# Patient Record
Sex: Female | Born: 1935 | Race: White | Hispanic: No
Health system: Southern US, Community
[De-identification: ages and names within clinical notes are randomized; demographics above are authoritative.]

---

## 2004-05-21 ENCOUNTER — Other Ambulatory Visit: Payer: Self-pay

## 2004-05-21 ENCOUNTER — Ambulatory Visit: Payer: Self-pay | Admitting: Specialist

## 2004-05-22 ENCOUNTER — Ambulatory Visit: Payer: Self-pay | Admitting: Specialist

## 2004-05-23 ENCOUNTER — Ambulatory Visit: Payer: Self-pay | Admitting: Specialist

## 2010-03-06 ENCOUNTER — Emergency Department: Payer: Self-pay | Admitting: Emergency Medicine

## 2010-08-07 ENCOUNTER — Emergency Department: Payer: Self-pay | Admitting: Emergency Medicine

## 2010-10-23 ENCOUNTER — Ambulatory Visit: Payer: Self-pay | Admitting: Internal Medicine

## 2010-11-06 ENCOUNTER — Inpatient Hospital Stay: Payer: Self-pay | Admitting: Internal Medicine

## 2010-11-13 DIAGNOSIS — I5022 Chronic systolic (congestive) heart failure: Secondary | ICD-10-CM

## 2010-11-13 DIAGNOSIS — I10 Essential (primary) hypertension: Secondary | ICD-10-CM

## 2010-11-13 DIAGNOSIS — I6990 Unspecified sequelae of unspecified cerebrovascular disease: Secondary | ICD-10-CM

## 2010-11-13 DIAGNOSIS — J189 Pneumonia, unspecified organism: Secondary | ICD-10-CM

## 2010-11-20 DIAGNOSIS — M109 Gout, unspecified: Secondary | ICD-10-CM

## 2010-11-23 ENCOUNTER — Ambulatory Visit: Payer: Self-pay | Admitting: Internal Medicine

## 2010-11-23 DIAGNOSIS — R0989 Other specified symptoms and signs involving the circulatory and respiratory systems: Secondary | ICD-10-CM

## 2010-11-23 DIAGNOSIS — R0609 Other forms of dyspnea: Secondary | ICD-10-CM

## 2010-11-23 DIAGNOSIS — I679 Cerebrovascular disease, unspecified: Secondary | ICD-10-CM

## 2010-11-23 DIAGNOSIS — M109 Gout, unspecified: Secondary | ICD-10-CM

## 2011-01-03 DIAGNOSIS — I6990 Unspecified sequelae of unspecified cerebrovascular disease: Secondary | ICD-10-CM

## 2011-01-03 DIAGNOSIS — I10 Essential (primary) hypertension: Secondary | ICD-10-CM

## 2011-01-03 DIAGNOSIS — I5022 Chronic systolic (congestive) heart failure: Secondary | ICD-10-CM

## 2011-01-03 DIAGNOSIS — J189 Pneumonia, unspecified organism: Secondary | ICD-10-CM

## 2011-01-04 ENCOUNTER — Telehealth: Payer: Self-pay | Admitting: *Deleted

## 2011-01-04 NOTE — Telephone Encounter (Signed)
Fax from [employer] [address] is on International aid/development worker.  Benicar isnt covered by insurance and they are asking if you want to change to cozaar.  Also, prior auth is needed for ipratropium/ albuterol, that form is on your desk also.

## 2011-01-04 NOTE — Telephone Encounter (Signed)
[employer] has faxed a questionaire for duoneb, this is on your desk.  They didn't send this with the first form that they faxed.

## 2011-01-04 NOTE — Telephone Encounter (Signed)
Form done and faxed back

## 2011-01-04 NOTE — Telephone Encounter (Signed)
Form done for the duoneb The benicar was prescribed by Dr Judithann Sheen, her regular doctor, and she should get this until she discusses it with him

## 2011-01-05 NOTE — Telephone Encounter (Signed)
I did this one already She needs it due to pneumonia, hypoxemia, and wheezing/bronchospasm

## 2011-01-07 NOTE — Telephone Encounter (Signed)
She will have to work this out with her regular physician Dr Judithann Sheen She has gone home from [facility]

## 2011-01-07 NOTE — Telephone Encounter (Signed)
Prior auth denied for duoneb, letter from [place] is on your desk.

## 2011-02-26 ENCOUNTER — Ambulatory Visit: Payer: Medicare Other | Admitting: Internal Medicine

## 2011-04-03 ENCOUNTER — Ambulatory Visit: Payer: Medicare Other | Admitting: Internal Medicine

## 2011-09-19 ENCOUNTER — Ambulatory Visit: Payer: BC Managed Care – PPO

## 2011-09-19 ENCOUNTER — Ambulatory Visit: Payer: BC Managed Care – PPO | Admitting: Internal Medicine

## 2011-09-23 ENCOUNTER — Ambulatory Visit: Payer: BC Managed Care – PPO | Admitting: Internal Medicine

## 2011-09-23 DIAGNOSIS — Z0289 Encounter for other administrative examinations: Secondary | ICD-10-CM

## 2011-10-14 ENCOUNTER — Ambulatory Visit: Payer: BC Managed Care – PPO

## 2012-08-02 ENCOUNTER — Emergency Department: Payer: Self-pay | Admitting: Emergency Medicine

## 2012-08-02 LAB — COMPREHENSIVE METABOLIC PANEL
Albumin: 4 g/dL (ref 3.4–5.0)
Alkaline Phosphatase: 155 U/L — ABNORMAL HIGH (ref 50–136)
Anion Gap: 6 — ABNORMAL LOW (ref 7–16)
Bilirubin,Total: 0.4 mg/dL (ref 0.2–1.0)
Calcium, Total: 9.5 mg/dL (ref 8.5–10.1)
Chloride: 106 mmol/L (ref 98–107)
Co2: 28 mmol/L (ref 21–32)
EGFR (Non-African Amer.): 42 — ABNORMAL LOW
Glucose: 113 mg/dL — ABNORMAL HIGH (ref 65–99)
Osmolality: 295 (ref 275–301)
SGOT(AST): 33 U/L (ref 15–37)
SGPT (ALT): 26 U/L (ref 12–78)
Sodium: 140 mmol/L (ref 136–145)
Total Protein: 7.6 g/dL (ref 6.4–8.2)

## 2012-08-02 LAB — URINALYSIS, COMPLETE
Bacteria: NONE SEEN
Bilirubin,UR: NEGATIVE
Hyaline Cast: 17
Ketone: NEGATIVE
Leukocyte Esterase: NEGATIVE
Protein: NEGATIVE
Specific Gravity: 1.01 (ref 1.003–1.030)
WBC UR: 1 /HPF (ref 0–5)

## 2012-08-02 LAB — CK TOTAL AND CKMB (NOT AT ARMC): CK-MB: 1.8 ng/mL (ref 0.5–3.6)

## 2012-08-02 LAB — CBC
HCT: 34.3 % — ABNORMAL LOW (ref 35.0–47.0)
Platelet: 196 10*3/uL (ref 150–440)

## 2012-10-17 ENCOUNTER — Inpatient Hospital Stay: Payer: Self-pay | Admitting: Internal Medicine

## 2012-10-18 LAB — CBC WITH DIFFERENTIAL/PLATELET
Basophil %: 0.4 %
Eosinophil %: 1.4 %
HCT: 31.7 % — ABNORMAL LOW (ref 35.0–47.0)
Lymphocyte #: 1.3 10*3/uL (ref 1.0–3.6)
Lymphocyte %: 15.3 %
MCH: 30.9 pg (ref 26.0–34.0)
MCHC: 34.1 g/dL (ref 32.0–36.0)
MCV: 91 fL (ref 80–100)
Monocyte #: 0.7 x10 3/mm (ref 0.2–0.9)
Monocyte %: 7.9 %
RDW: 14 % (ref 11.5–14.5)
WBC: 8.7 10*3/uL (ref 3.6–11.0)

## 2012-10-18 LAB — BASIC METABOLIC PANEL
BUN: 42 mg/dL — ABNORMAL HIGH (ref 7–18)
Calcium, Total: 9.2 mg/dL (ref 8.5–10.1)
Chloride: 107 mmol/L (ref 98–107)
Osmolality: 292 (ref 275–301)
Potassium: 4 mmol/L (ref 3.5–5.1)
Sodium: 141 mmol/L (ref 136–145)

## 2012-10-19 LAB — HEMOGLOBIN
HGB: 9.9 g/dL — ABNORMAL LOW (ref 12.0–16.0)
HGB: 9.7 g/dL — ABNORMAL LOW (ref 12.0–16.0)

## 2012-10-20 LAB — CBC WITH DIFFERENTIAL/PLATELET
Basophil #: 0 10*3/uL (ref 0.0–0.1)
Basophil %: 0.3 %
Eosinophil #: 0.1 10*3/uL (ref 0.0–0.7)
Eosinophil %: 0.9 %
HCT: 28.8 % — ABNORMAL LOW (ref 35.0–47.0)
Lymphocyte #: 1.6 10*3/uL (ref 1.0–3.6)
Lymphocyte %: 19.2 %
MCH: 30.3 pg (ref 26.0–34.0)
MCHC: 33.6 g/dL (ref 32.0–36.0)
Monocyte #: 0.8 x10 3/mm (ref 0.2–0.9)
Monocyte %: 9.2 %
Neutrophil #: 5.8 10*3/uL (ref 1.4–6.5)
Neutrophil %: 70.4 %
Platelet: 162 10*3/uL (ref 150–440)
RBC: 3.19 10*6/uL — ABNORMAL LOW (ref 3.80–5.20)
RDW: 13.9 % (ref 11.5–14.5)
WBC: 8.3 10*3/uL (ref 3.6–11.0)

## 2012-10-20 LAB — BASIC METABOLIC PANEL
BUN: 30 mg/dL — ABNORMAL HIGH (ref 7–18)
Chloride: 110 mmol/L — ABNORMAL HIGH (ref 98–107)
Co2: 28 mmol/L (ref 21–32)
Creatinine: 0.9 mg/dL (ref 0.60–1.30)
EGFR (African American): >60
Glucose: 92 mg/dL (ref 65–99)
Osmolality: 291 (ref 275–301)
Sodium: 143 mmol/L (ref 136–145)

## 2012-10-21 LAB — CBC WITH DIFFERENTIAL/PLATELET
Basophil %: 0.3 %
Eosinophil %: 1.4 %
HCT: 28.2 % — ABNORMAL LOW (ref 35.0–47.0)
HGB: 9.8 g/dL — ABNORMAL LOW (ref 12.0–16.0)
Lymphocyte #: 1.2 10*3/uL (ref 1.0–3.6)
MCH: 30.9 pg (ref 26.0–34.0)
MCV: 89 fL (ref 80–100)
Monocyte #: 0.7 x10 3/mm (ref 0.2–0.9)
Neutrophil #: 5.4 10*3/uL (ref 1.4–6.5)
RBC: 3.16 10*6/uL — ABNORMAL LOW (ref 3.80–5.20)
RDW: 13.5 % (ref 11.5–14.5)

## 2012-10-21 LAB — BASIC METABOLIC PANEL
Anion Gap: 8 (ref 7–16)
Chloride: 107 mmol/L (ref 98–107)
Creatinine: 0.85 mg/dL (ref 0.60–1.30)
Glucose: 88 mg/dL (ref 65–99)
Osmolality: 285 (ref 275–301)
Potassium: 3.4 mmol/L — ABNORMAL LOW (ref 3.5–5.1)
Sodium: 141 mmol/L (ref 136–145)

## 2012-11-04 DIAGNOSIS — I120 Hypertensive chronic kidney disease with stage 5 chronic kidney disease or end stage renal disease: Secondary | ICD-10-CM

## 2012-11-04 DIAGNOSIS — E785 Hyperlipidemia, unspecified: Secondary | ICD-10-CM

## 2012-11-04 DIAGNOSIS — D649 Anemia, unspecified: Secondary | ICD-10-CM

## 2012-11-04 DIAGNOSIS — K922 Gastrointestinal hemorrhage, unspecified: Secondary | ICD-10-CM

## 2012-11-04 DIAGNOSIS — I5022 Chronic systolic (congestive) heart failure: Secondary | ICD-10-CM

## 2013-12-22 ENCOUNTER — Ambulatory Visit: Payer: Self-pay | Admitting: Internal Medicine

## 2014-01-08 LAB — COMPREHENSIVE METABOLIC PANEL
ALBUMIN: 3.2 g/dL — AB (ref 3.4–5.0)
AST: 31 U/L (ref 15–37)
Alkaline Phosphatase: 143 U/L — ABNORMAL HIGH
Anion Gap: 8 (ref 7–16)
BUN: 41 mg/dL — ABNORMAL HIGH (ref 7–18)
Bilirubin,Total: 0.2 mg/dL (ref 0.2–1.0)
CALCIUM: 9.2 mg/dL (ref 8.5–10.1)
CHLORIDE: 108 mmol/L — AB (ref 98–107)
Co2: 26 mmol/L (ref 21–32)
Creatinine: 1.18 mg/dL (ref 0.60–1.30)
EGFR (African American): 44 — ABNORMAL LOW
GFR CALC NON AF AMER: 38 — AB
GLUCOSE: 93 mg/dL (ref 65–99)
Osmolality: 293 (ref 275–301)
POTASSIUM: 4.3 mmol/L (ref 3.5–5.1)
SGPT (ALT): 20 U/L (ref 12–78)
SODIUM: 142 mmol/L (ref 136–145)
TOTAL PROTEIN: 6.9 g/dL (ref 6.4–8.2)

## 2014-01-08 LAB — URINALYSIS, COMPLETE
BILIRUBIN, UR: NEGATIVE
BLOOD: NEGATIVE
Bacteria: NONE SEEN
Bilirubin,UR: NEGATIVE
GLUCOSE, UR: NEGATIVE mg/dL (ref 0–75)
Glucose,UR: NEGATIVE mg/dL (ref 0–75)
Ketone: NEGATIVE
Ketone: NEGATIVE
NITRITE: NEGATIVE
Nitrite: NEGATIVE
PH: 6 (ref 4.5–8.0)
Ph: 6 (ref 4.5–8.0)
Protein: 100
Protein: NEGATIVE
SQUAMOUS EPITHELIAL: NONE SEEN
Specific Gravity: 1.011 (ref 1.003–1.030)
Specific Gravity: 1.005 (ref 1.003–1.030)
Squamous Epithelial: NONE SEEN
WBC UR: 89 /HPF (ref 0–5)

## 2014-01-08 LAB — CBC
HCT: 28.4 % — AB (ref 35.0–47.0)
HGB: 9.1 g/dL — ABNORMAL LOW (ref 12.0–16.0)
MCH: 26.8 pg (ref 26.0–34.0)
MCHC: 31.9 g/dL — ABNORMAL LOW (ref 32.0–36.0)
MCV: 84 fL (ref 80–100)
PLATELETS: 212 10*3/uL (ref 150–440)
RBC: 3.38 10*6/uL — ABNORMAL LOW (ref 3.80–5.20)
RDW: 14 % (ref 11.5–14.5)
WBC: 7.1 10*3/uL (ref 3.6–11.0)

## 2014-01-08 LAB — CBC WITH DIFFERENTIAL/PLATELET
BASOS ABS: 0 10*3/uL (ref 0.0–0.1)
Basophil %: 0.4 %
Eosinophil #: 0.1 10*3/uL (ref 0.0–0.7)
Eosinophil %: 1 %
HCT: 31.7 % — ABNORMAL LOW (ref 35.0–47.0)
HGB: 10.3 g/dL — AB (ref 12.0–16.0)
Lymphocyte #: 1.7 10*3/uL (ref 1.0–3.6)
Lymphocyte %: 18.6 %
MCH: 27 pg (ref 26.0–34.0)
MCHC: 32.4 g/dL (ref 32.0–36.0)
MCV: 83 fL (ref 80–100)
MONO ABS: 0.8 x10 3/mm (ref 0.2–0.9)
MONOS PCT: 8.2 %
NEUTROS PCT: 71.8 %
Neutrophil #: 6.8 10*3/uL — ABNORMAL HIGH (ref 1.4–6.5)
PLATELETS: 243 10*3/uL (ref 150–440)
RBC: 3.8 10*6/uL (ref 3.80–5.20)
RDW: 13.7 % (ref 11.5–14.5)
WBC: 9.4 10*3/uL (ref 3.6–11.0)

## 2014-01-08 LAB — IRON AND TIBC
IRON BIND. CAP.(TOTAL): 518 ug/dL — AB (ref 250–450)
IRON: 36 ug/dL — AB (ref 50–170)
Iron Saturation: 7 %
Unbound Iron-Bind.Cap.: 482 ug/dL

## 2014-01-08 LAB — FOLATE: Folic Acid: 63.6 ng/mL (ref 3.1–100.0)

## 2014-01-08 LAB — FERRITIN: Ferritin (ARMC): 8 ng/mL (ref 8–388)

## 2014-01-08 LAB — PROTIME-INR
INR: 1
Prothrombin Time: 13 secs (ref 11.5–14.7)

## 2014-01-09 ENCOUNTER — Inpatient Hospital Stay: Payer: Self-pay | Admitting: Internal Medicine

## 2014-01-09 LAB — COMPREHENSIVE METABOLIC PANEL
ALBUMIN: 3.1 g/dL — AB (ref 3.4–5.0)
ANION GAP: 8 (ref 7–16)
Alkaline Phosphatase: 141 U/L — ABNORMAL HIGH
BUN: 32 mg/dL — AB (ref 7–18)
Bilirubin,Total: 0.3 mg/dL (ref 0.2–1.0)
CALCIUM: 9.1 mg/dL (ref 8.5–10.1)
Chloride: 106 mmol/L (ref 98–107)
Co2: 27 mmol/L (ref 21–32)
Creatinine: 1.1 mg/dL (ref 0.60–1.30)
EGFR (Non-African Amer.): 42 — ABNORMAL LOW
GFR CALC AF AMER: 48 — AB
GLUCOSE: 91 mg/dL (ref 65–99)
OSMOLALITY: 288 (ref 275–301)
POTASSIUM: 3.7 mmol/L (ref 3.5–5.1)
SGOT(AST): 30 U/L (ref 15–37)
SGPT (ALT): 21 U/L (ref 12–78)
SODIUM: 141 mmol/L (ref 136–145)
Total Protein: 6.6 g/dL (ref 6.4–8.2)

## 2014-01-09 LAB — CBC WITH DIFFERENTIAL/PLATELET
BASOS ABS: 0 10*3/uL (ref 0.0–0.1)
BASOS PCT: 0.5 %
Eosinophil #: 0.1 10*3/uL (ref 0.0–0.7)
Eosinophil %: 0.9 %
HCT: 28.1 % — ABNORMAL LOW (ref 35.0–47.0)
HGB: 8.7 g/dL — AB (ref 12.0–16.0)
LYMPHS PCT: 14.7 %
Lymphocyte #: 1.1 10*3/uL (ref 1.0–3.6)
MCH: 26.1 pg (ref 26.0–34.0)
MCHC: 31.1 g/dL — ABNORMAL LOW (ref 32.0–36.0)
MCV: 84 fL (ref 80–100)
MONO ABS: 0.6 x10 3/mm (ref 0.2–0.9)
MONOS PCT: 7.5 %
Neutrophil #: 5.8 10*3/uL (ref 1.4–6.5)
Neutrophil %: 76.4 %
PLATELETS: 217 10*3/uL (ref 150–440)
RBC: 3.35 10*6/uL — AB (ref 3.80–5.20)
RDW: 13.9 % (ref 11.5–14.5)
WBC: 7.6 10*3/uL (ref 3.6–11.0)

## 2014-01-09 LAB — PROTIME-INR
INR: 1
Prothrombin Time: 12.9 secs (ref 11.5–14.7)

## 2014-01-10 LAB — CBC WITH DIFFERENTIAL/PLATELET
Basophil #: 0 10*3/uL (ref 0.0–0.1)
Basophil %: 0.3 %
EOS ABS: 0 10*3/uL (ref 0.0–0.7)
Eosinophil %: 0.3 %
HCT: 28.5 % — AB (ref 35.0–47.0)
HGB: 9.1 g/dL — AB (ref 12.0–16.0)
LYMPHS ABS: 1.2 10*3/uL (ref 1.0–3.6)
Lymphocyte %: 16.7 %
MCH: 26.7 pg (ref 26.0–34.0)
MCHC: 32 g/dL (ref 32.0–36.0)
MCV: 83 fL (ref 80–100)
MONOS PCT: 6.9 %
Monocyte #: 0.5 x10 3/mm (ref 0.2–0.9)
NEUTROS PCT: 75.8 %
Neutrophil #: 5.3 10*3/uL (ref 1.4–6.5)
Platelet: 217 10*3/uL (ref 150–440)
RBC: 3.41 10*6/uL — AB (ref 3.80–5.20)
RDW: 13.9 % (ref 11.5–14.5)
WBC: 7 10*3/uL (ref 3.6–11.0)

## 2014-01-10 LAB — BASIC METABOLIC PANEL
Anion Gap: 10 (ref 7–16)
BUN: 36 mg/dL — AB (ref 7–18)
CO2: 27 mmol/L (ref 21–32)
Calcium, Total: 9.1 mg/dL (ref 8.5–10.1)
Chloride: 105 mmol/L (ref 98–107)
Creatinine: 1.3 mg/dL (ref 0.60–1.30)
EGFR (African American): 40 — ABNORMAL LOW
EGFR (Non-African Amer.): 34 — ABNORMAL LOW
GLUCOSE: 84 mg/dL (ref 65–99)
Osmolality: 291 (ref 275–301)
POTASSIUM: 3.4 mmol/L — AB (ref 3.5–5.1)
Sodium: 142 mmol/L (ref 136–145)

## 2014-01-10 LAB — URINE CULTURE

## 2014-01-11 LAB — CBC WITH DIFFERENTIAL/PLATELET
Basophil #: 0 10*3/uL (ref 0.0–0.1)
Basophil %: 0.5 %
EOS PCT: 0.9 %
Eosinophil #: 0.1 10*3/uL (ref 0.0–0.7)
HCT: 27.6 % — AB (ref 35.0–47.0)
HGB: 8.7 g/dL — ABNORMAL LOW (ref 12.0–16.0)
LYMPHS PCT: 20.4 %
Lymphocyte #: 1.5 10*3/uL (ref 1.0–3.6)
MCH: 26.2 pg (ref 26.0–34.0)
MCHC: 31.5 g/dL — ABNORMAL LOW (ref 32.0–36.0)
MCV: 83 fL (ref 80–100)
Monocyte #: 0.7 x10 3/mm (ref 0.2–0.9)
Monocyte %: 10 %
Neutrophil #: 4.9 10*3/uL (ref 1.4–6.5)
Neutrophil %: 68.2 %
PLATELETS: 208 10*3/uL (ref 150–440)
RBC: 3.32 10*6/uL — ABNORMAL LOW (ref 3.80–5.20)
RDW: 13.9 % (ref 11.5–14.5)
WBC: 7.2 10*3/uL (ref 3.6–11.0)

## 2014-01-11 LAB — BASIC METABOLIC PANEL
Anion Gap: 8 (ref 7–16)
BUN: 35 mg/dL — ABNORMAL HIGH (ref 7–18)
CALCIUM: 8.6 mg/dL (ref 8.5–10.1)
Chloride: 105 mmol/L (ref 98–107)
Co2: 30 mmol/L (ref 21–32)
Creatinine: 1.53 mg/dL — ABNORMAL HIGH (ref 0.60–1.30)
EGFR (Non-African Amer.): 28 — ABNORMAL LOW
GFR CALC AF AMER: 32 — AB
Glucose: 99 mg/dL (ref 65–99)
Osmolality: 293 (ref 275–301)
Potassium: 2.7 mmol/L — ABNORMAL LOW (ref 3.5–5.1)
Sodium: 143 mmol/L (ref 136–145)

## 2014-01-12 LAB — BASIC METABOLIC PANEL
ANION GAP: 8 (ref 7–16)
BUN: 32 mg/dL — ABNORMAL HIGH (ref 7–18)
Calcium, Total: 8.8 mg/dL (ref 8.5–10.1)
Chloride: 108 mmol/L — ABNORMAL HIGH (ref 98–107)
Co2: 26 mmol/L (ref 21–32)
Creatinine: 1.37 mg/dL — ABNORMAL HIGH (ref 0.60–1.30)
EGFR (African American): 37 — ABNORMAL LOW
EGFR (Non-African Amer.): 32 — ABNORMAL LOW
Glucose: 114 mg/dL — ABNORMAL HIGH (ref 65–99)
OSMOLALITY: 291 (ref 275–301)
Potassium: 3.4 mmol/L — ABNORMAL LOW (ref 3.5–5.1)
Sodium: 142 mmol/L (ref 136–145)

## 2014-01-12 LAB — CBC WITH DIFFERENTIAL/PLATELET
BASOS PCT: 0.2 %
Basophil #: 0 10*3/uL (ref 0.0–0.1)
EOS ABS: 0.1 10*3/uL (ref 0.0–0.7)
Eosinophil %: 1.1 %
HCT: 28.8 % — AB (ref 35.0–47.0)
HGB: 9.4 g/dL — ABNORMAL LOW (ref 12.0–16.0)
Lymphocyte #: 1.2 10*3/uL (ref 1.0–3.6)
Lymphocyte %: 15.6 %
MCH: 26.8 pg (ref 26.0–34.0)
MCHC: 32.7 g/dL (ref 32.0–36.0)
MCV: 82 fL (ref 80–100)
MONO ABS: 0.7 x10 3/mm (ref 0.2–0.9)
MONOS PCT: 9.1 %
NEUTROS ABS: 5.8 10*3/uL (ref 1.4–6.5)
Neutrophil %: 74 %
Platelet: 221 10*3/uL (ref 150–440)
RBC: 3.52 10*6/uL — AB (ref 3.80–5.20)
RDW: 13.8 % (ref 11.5–14.5)
WBC: 7.8 10*3/uL (ref 3.6–11.0)

## 2014-01-18 DIAGNOSIS — E785 Hyperlipidemia, unspecified: Secondary | ICD-10-CM

## 2014-01-18 DIAGNOSIS — I6990 Unspecified sequelae of unspecified cerebrovascular disease: Secondary | ICD-10-CM

## 2014-01-18 DIAGNOSIS — I10 Essential (primary) hypertension: Secondary | ICD-10-CM

## 2014-01-18 DIAGNOSIS — I5022 Chronic systolic (congestive) heart failure: Secondary | ICD-10-CM

## 2014-01-18 DIAGNOSIS — K922 Gastrointestinal hemorrhage, unspecified: Secondary | ICD-10-CM

## 2014-01-21 DIAGNOSIS — K922 Gastrointestinal hemorrhage, unspecified: Secondary | ICD-10-CM

## 2014-01-21 DIAGNOSIS — D62 Acute posthemorrhagic anemia: Secondary | ICD-10-CM

## 2014-01-21 DIAGNOSIS — D709 Neutropenia, unspecified: Secondary | ICD-10-CM

## 2014-01-22 ENCOUNTER — Ambulatory Visit: Payer: Self-pay | Admitting: Internal Medicine

## 2014-03-14 ENCOUNTER — Inpatient Hospital Stay: Payer: Self-pay | Admitting: Internal Medicine

## 2014-03-14 LAB — CK-MB
CK-MB: 2.4 ng/mL (ref 0.5–3.6)
CK-MB: 2.6 ng/mL (ref 0.5–3.6)

## 2014-03-14 LAB — BASIC METABOLIC PANEL
Anion Gap: 7 (ref 7–16)
BUN: 30 mg/dL — AB (ref 7–18)
Calcium, Total: 8.6 mg/dL (ref 8.5–10.1)
Chloride: 108 mmol/L — ABNORMAL HIGH (ref 98–107)
Co2: 26 mmol/L (ref 21–32)
Creatinine: 1.37 mg/dL — ABNORMAL HIGH (ref 0.60–1.30)
EGFR (Non-African Amer.): 32 — ABNORMAL LOW
GFR CALC AF AMER: 37 — AB
Glucose: 131 mg/dL — ABNORMAL HIGH (ref 65–99)
OSMOLALITY: 289 (ref 275–301)
Potassium: 4.3 mmol/L (ref 3.5–5.1)
Sodium: 141 mmol/L (ref 136–145)

## 2014-03-14 LAB — URINALYSIS, COMPLETE
BLOOD: NEGATIVE
Bilirubin,UR: NEGATIVE
GLUCOSE, UR: NEGATIVE mg/dL (ref 0–75)
Ketone: NEGATIVE
NITRITE: POSITIVE
Ph: 5 (ref 4.5–8.0)
Specific Gravity: 1.01 (ref 1.003–1.030)

## 2014-03-14 LAB — CBC
HCT: 24.9 % — ABNORMAL LOW (ref 35.0–47.0)
HGB: 7.5 g/dL — ABNORMAL LOW (ref 12.0–16.0)
MCH: 21.9 pg — AB (ref 26.0–34.0)
MCHC: 30.2 g/dL — ABNORMAL LOW (ref 32.0–36.0)
MCV: 73 fL — ABNORMAL LOW (ref 80–100)
PLATELETS: 279 10*3/uL (ref 150–440)
RBC: 3.44 10*6/uL — AB (ref 3.80–5.20)
RDW: 17.4 % — ABNORMAL HIGH (ref 11.5–14.5)
WBC: 7.7 10*3/uL (ref 3.6–11.0)

## 2014-03-14 LAB — TROPONIN I
Troponin-I: <0.02 ng/mL
Troponin-I: <0.02 ng/mL

## 2014-03-15 LAB — CK-MB: CK-MB: 2.1 ng/mL (ref 0.5–3.6)

## 2014-03-15 LAB — TROPONIN I: Troponin-I: <0.02 ng/mL

## 2014-03-16 LAB — CBC WITH DIFFERENTIAL/PLATELET
Basophil #: 0 10*3/uL (ref 0.0–0.1)
Basophil %: 0.4 %
Eosinophil #: 0.1 10*3/uL (ref 0.0–0.7)
Eosinophil %: 1.2 %
HCT: 27.2 % — ABNORMAL LOW (ref 35.0–47.0)
HGB: 8.2 g/dL — AB (ref 12.0–16.0)
Lymphocyte #: 1.3 10*3/uL (ref 1.0–3.6)
Lymphocyte %: 19 %
MCH: 22.4 pg — AB (ref 26.0–34.0)
MCHC: 30 g/dL — AB (ref 32.0–36.0)
MCV: 75 fL — ABNORMAL LOW (ref 80–100)
Monocyte #: 0.7 x10 3/mm (ref 0.2–0.9)
Monocyte %: 10.8 %
Neutrophil #: 4.6 10*3/uL (ref 1.4–6.5)
Neutrophil %: 68.6 %
Platelet: 242 10*3/uL (ref 150–440)
RBC: 3.65 10*6/uL — AB (ref 3.80–5.20)
RDW: 18.4 % — ABNORMAL HIGH (ref 11.5–14.5)
WBC: 6.7 10*3/uL (ref 3.6–11.0)

## 2014-03-16 LAB — BASIC METABOLIC PANEL
ANION GAP: 7 (ref 7–16)
BUN: 36 mg/dL — AB (ref 7–18)
CALCIUM: 8.6 mg/dL (ref 8.5–10.1)
CREATININE: 1.31 mg/dL — AB (ref 0.60–1.30)
Chloride: 106 mmol/L (ref 98–107)
Co2: 27 mmol/L (ref 21–32)
EGFR (African American): 39 — ABNORMAL LOW
GFR CALC NON AF AMER: 34 — AB
Glucose: 90 mg/dL (ref 65–99)
OSMOLALITY: 287 (ref 275–301)
Potassium: 3.5 mmol/L (ref 3.5–5.1)
Sodium: 140 mmol/L (ref 136–145)

## 2014-03-17 LAB — CBC WITH DIFFERENTIAL/PLATELET
BASOS ABS: 0 10*3/uL (ref 0.0–0.1)
Basophil %: 0.3 %
Eosinophil #: 0.1 10*3/uL (ref 0.0–0.7)
Eosinophil %: 1.4 %
HCT: 28 % — AB (ref 35.0–47.0)
HGB: 8.5 g/dL — AB (ref 12.0–16.0)
LYMPHS ABS: 1.3 10*3/uL (ref 1.0–3.6)
LYMPHS PCT: 19.7 %
MCH: 22.7 pg — ABNORMAL LOW (ref 26.0–34.0)
MCHC: 30.4 g/dL — ABNORMAL LOW (ref 32.0–36.0)
MCV: 75 fL — AB (ref 80–100)
MONO ABS: 0.7 x10 3/mm (ref 0.2–0.9)
MONOS PCT: 10.4 %
NEUTROS PCT: 68.2 %
Neutrophil #: 4.5 10*3/uL (ref 1.4–6.5)
Platelet: 221 10*3/uL (ref 150–440)
RBC: 3.75 10*6/uL — ABNORMAL LOW (ref 3.80–5.20)
RDW: 18 % — AB (ref 11.5–14.5)
WBC: 6.6 10*3/uL (ref 3.6–11.0)

## 2014-03-17 LAB — BASIC METABOLIC PANEL
Anion Gap: 6 — ABNORMAL LOW (ref 7–16)
BUN: 44 mg/dL — ABNORMAL HIGH (ref 7–18)
CALCIUM: 8.4 mg/dL — AB (ref 8.5–10.1)
CHLORIDE: 107 mmol/L (ref 98–107)
CREATININE: 1.5 mg/dL — AB (ref 0.60–1.30)
Co2: 28 mmol/L (ref 21–32)
EGFR (African American): 41 — ABNORMAL LOW
EGFR (Non-African Amer.): 34 — ABNORMAL LOW
Glucose: 87 mg/dL (ref 65–99)
OSMOLALITY: 292 (ref 275–301)
Potassium: 3.7 mmol/L (ref 3.5–5.1)
SODIUM: 141 mmol/L (ref 136–145)

## 2014-03-18 DIAGNOSIS — I5032 Chronic diastolic (congestive) heart failure: Secondary | ICD-10-CM

## 2014-03-18 DIAGNOSIS — J189 Pneumonia, unspecified organism: Secondary | ICD-10-CM

## 2014-03-18 DIAGNOSIS — J84112 Idiopathic pulmonary fibrosis: Secondary | ICD-10-CM

## 2014-03-18 DIAGNOSIS — I10 Essential (primary) hypertension: Secondary | ICD-10-CM

## 2014-03-18 DIAGNOSIS — J449 Chronic obstructive pulmonary disease, unspecified: Secondary | ICD-10-CM

## 2014-03-19 LAB — CULTURE, BLOOD (SINGLE)

## 2014-03-30 DIAGNOSIS — R1031 Right lower quadrant pain: Secondary | ICD-10-CM

## 2014-03-30 DIAGNOSIS — K922 Gastrointestinal hemorrhage, unspecified: Secondary | ICD-10-CM

## 2014-03-30 DIAGNOSIS — D62 Acute posthemorrhagic anemia: Secondary | ICD-10-CM

## 2014-04-12 DIAGNOSIS — J8410 Pulmonary fibrosis, unspecified: Secondary | ICD-10-CM

## 2014-04-12 DIAGNOSIS — I10 Essential (primary) hypertension: Secondary | ICD-10-CM

## 2014-04-12 DIAGNOSIS — F39 Unspecified mood [affective] disorder: Secondary | ICD-10-CM

## 2014-04-12 DIAGNOSIS — I5032 Chronic diastolic (congestive) heart failure: Secondary | ICD-10-CM

## 2014-04-12 DIAGNOSIS — K922 Gastrointestinal hemorrhage, unspecified: Secondary | ICD-10-CM

## 2014-05-11 DIAGNOSIS — F323 Major depressive disorder, single episode, severe with psychotic features: Secondary | ICD-10-CM

## 2014-05-11 DIAGNOSIS — J84112 Idiopathic pulmonary fibrosis: Secondary | ICD-10-CM

## 2014-05-11 DIAGNOSIS — I5030 Unspecified diastolic (congestive) heart failure: Secondary | ICD-10-CM

## 2014-05-11 DIAGNOSIS — I10 Essential (primary) hypertension: Secondary | ICD-10-CM

## 2014-05-11 DIAGNOSIS — M1000 Idiopathic gout, unspecified site: Secondary | ICD-10-CM

## 2014-05-11 DIAGNOSIS — K922 Gastrointestinal hemorrhage, unspecified: Secondary | ICD-10-CM

## 2014-05-30 ENCOUNTER — Telehealth: Payer: Self-pay

## 2014-05-30 NOTE — Telephone Encounter (Signed)
This is actually up from last time

## 2014-05-30 NOTE — Telephone Encounter (Signed)
PLEASE NOTE: All timestamps contained within this report are represented as [place] Standard Time. CONFIDENTIALTY NOTICE: This fax transmission is intended only for the addressee. It contains information that is legally privileged, confidential or otherwise protected from use or disclosure. If you are not the intended recipient, you are strictly prohibited from reviewing, disclosing, copying using or disseminating any of this information or taking any action in reliance on or regarding this information. If you have received this fax in error, please notify us immediately by telephone so that we can arrange for its return to us. Phone: [phone], Toll-Free: [phone], Fax: [phone] Page: 1 of 1 Call Id: [id number] [facility] Client TELEPHONE ADVICE RECORD [facility] Patient Name: Madison LoopANCY Tarter Gender: Female DOB: 1912/11/11 Age: [age] Y 2 M 19 D Return Phone Number: Address: City/State/Zip: [place] StatisticianClient [facility] - Night Physician Tillman AbideLetvak, Richard Contact Type Call Call Type Page Only Caller Name Madison Patel Relationship To Patient Provider Is this call to report lab results? No Return Phone Number Unavailable Initial Comment (1st attempt, Phil McGowen) Caller States Madison from [facility] at ph [phone] , Pt 's lab came back, Hemoglobin is low Nurse Assessment Guidelines Guideline Title Affirmed Question Affirmed Notes Nurse Date/Time (Eastern Time) Disp. Time Lamount Cohen(Eastern Time) Disposition Final User 05/27/2014 10:46:35 PM Send to [facility] Paging Queue Namon CirriFlanagan, Kathy 05/27/2014 10:54:04 PM Paged On Call back to Call Center - PC LakelineWood, Amy 05/27/2014 10:57:30 PM Page Completed Valentino HueYes Lucretia RoersWood, Amy Comments User: Angelique BlonderAmy, Wood Date/Time Lamount Cohen(Eastern Time): 05/27/2014 10:56:23 PM Phoned on call. Information provided. On call transferred to caller.

## 2014-06-08 ENCOUNTER — Telehealth: Payer: Self-pay

## 2014-06-08 NOTE — Telephone Encounter (Signed)
PLEASE NOTE: All timestamps contained within this report are represented as [place] Standard Time. CONFIDENTIALTY NOTICE: This fax transmission is intended only for the addressee. It contains information that is legally privileged, confidential or otherwise protected from use or disclosure. If you are not the intended recipient, you are strictly prohibited from reviewing, disclosing, copying using or disseminating any of this information or taking any action in reliance on or regarding this information. If you have received this fax in error, please notify us immediately by telephone so that we can arrange for its return to us. Phone: [phone], Toll-Free: [phone], Fax: [phone] Page: 1 of 3 Call Id: [id number] [facility] - Client TELEPHONE ADVICE RECORD [facility] Patient Name: Madison Patel Gender: Female DOB: 1913/04/06 Age: [age] Y 2 M 30 D Return Phone Number: Address: City/State/Zip: [place] StatisticianClient [facility] - [facility] Physician Tillman AbideLetvak, Richard Contact Type Call Call Type Triage / Clinical Caller Name Charyl Biggerlizabeth Mitchell Relationship To Patient Other Return Phone Number Unavailable Chief Complaint Paging or Request for Consult Initial Comment Caller states that she is [facility]el at [facility]. She is calling to notify the doctor of a 2.5 pound weight gain. 132 yesterday and is at 134.5 today. Call back # is [phone]. Nurse Assessment Nurse: Sherilyn CooterHenry, RN, Thurmond ButtsWade Date/Time Lamount Cohen(Eastern Time): 06/08/2014 7:19:31 AM Confirm and document reason for call. If symptomatic, describe symptoms. ---Per Massie BougieBelinda, the patient had a weight gain of 2.5lbs in a 24 hour time period. Denies any SOB and chest pains. She states that she is currently on Lasix 40mg /day. She states that the last time this happened, the doctor ordered an extra 20mg  of Lasix. Has  the patient traveled out of the country within the last 30 days? ---Not Applicable Does the patient require triage? ---No Guidelines Guideline Title Affirmed Question Affirmed Notes Nurse Date/Time (Eastern Time) Disp. Time Lamount Cohen(Eastern Time) Disposition Final User 06/08/2014 6:38:36 AM [facility] to [facility] Paging Queue Antonieta IbaHodge, Shannon 06/08/2014 7:03:20 AM Send To Clinical Follow Up Youlanda RoysQueue Hodge, Shannon 06/08/2014 7:22:49 AM Called On-Call Provider Sherilyn CooterHenry, RN, Thurmond ButtsWade 06/08/2014 7:26:11 AM Clinical Call Yes Sherilyn CooterHenry, RN, Thurmond ButtsWade After Care Instructions Given Call Event Type User Date / Time Description Verbal Orders/Maintenance Medications Medication Refill Route Dosage Regime Duration Admin Instructions User Name Furosemide 20mg  Oral Take a one time dose orally. 1 Days Furosemide 20mg  Take a one time dose orally. Sherilyn CooterHenry, RN, Thurmond ButtsWade PLEASE NOTE: All timestamps contained within this report are represented as [place] Standard Time. CONFIDENTIALTY NOTICE: This fax transmission is intended only for the addressee. It contains information that is legally privileged, confidential or otherwise protected from use or disclosure. If you are not the intended recipient, you are strictly prohibited from reviewing, disclosing, copying using or disseminating any of this information or taking any action in reliance on or regarding this information. If you have received this fax in error, please notify us immediately by telephone so that we can arrange for its return to us. Phone: [phone], Toll-Free: [phone], Fax: [phone] Page: 2 of 3 Call Id: [id number] Verbal Orders/Maintenance Medications Medication Refill Route Dosage Regime Duration Admin Instructions User Name #1, 0RF per Dr. Ronna PolioJennifer Walker Comments User: Ronney AstersWade, Henry, RN Date/Time Lamount Cohen(Eastern Time): 06/08/2014 7:25:58 AM 0725- Madison Patel advised of Dr. Tilman NeatWalker's order. She verbalized understanding. Paging DoctorName DoctorPhone DateTime Result/Outcome  Notes Ronna PolioWalker, Jennifer [phone] 06/08/2014 7:22:49 AM Called On Call Provider - Reached Ronna PolioWalker, Jennifer 06/08/2014 7:23:14 AM  Spoke with On Call - General [phone]- I called Dr. Ronna PolioJennifer Walker. Information given. Order rexeived. PLEASE NOTE: All timestamps contained within this report are represented as [place] Standard Time. CONFIDENTIALTY NOTICE: This fax transmission is intended only for the addressee. It contains information that is legally privileged, confidential or otherwise protected from use or disclosure. If you are not the intended recipient, you are strictly prohibited from reviewing, disclosing, copying using or disseminating any of this information or taking any action in reliance on or regarding this information. If you have received this fax in error, please notify us immediately by telephone so that we can arrange for its return to us. Phone: [phone], Toll-Free: [phone], Fax: [phone] Page: 3 of 3 Call Id: [phone] [facility] [address] [place], [place] [address] [phone] Fax: [phone] MEDICATION ORDER [facility] - Night Date: 06/08/2014 From: QI Department To: Tillman AbideLetvak, Richard Please sign the order for the approved drug(s) given by our call center nurse on your behalf. Fax to [phone] within 5 business days. Thank you. Date Lamount Cohen(Eastern Time): 06/08/2014 6:34:30 AM Triage RN: Ronney AstersWade Henry, RN NAME: Madison Patel PHONE NUMBER: BIRTHDATE: April 02, 1913 ADDRESS: CITY/STATE/ZIP: [place] CALLER: Other NAME: Charyl BiggerElizabeth Mitchell Rx Given Medication Refill Route Dosage Regime Duration Admin Instructions Furosemide 20mg  Oral Take a one time dose orally. 1 Days Furosemide 20mg  time dose orally. #1, Dr. Ronna PolioJennifer Walker MD Signature Date

## 2014-06-08 NOTE — Telephone Encounter (Signed)
Can review her status there today--Madison Patel there this morning and I will be there in the afternoon

## 2014-06-09 NOTE — Telephone Encounter (Signed)
[facility]

## 2014-06-09 NOTE — Telephone Encounter (Signed)
PLEASE NOTE: All timestamps contained within this report are represented as [place] Standard Time. CONFIDENTIALTY NOTICE: This fax transmission is intended only for the addressee. It contains information that is legally privileged, confidential or otherwise protected from use or disclosure. If you are not the intended recipient, you are strictly prohibited from reviewing, disclosing, copying using or disseminating any of this information or taking any action in reliance on or regarding this information. If you have received this fax in error, please notify us immediately by telephone so that we can arrange for its return to us. Phone: [phone], Toll-Free: [phone], Fax: [phone] Page: 1 of 3 Call Id: [id number] [facility] - Client TELEPHONE ADVICE RECORD [facility] Patient Name: Madison LoopANCY Mccarthy Gender: Female DOB: 06/30/1912 Age: 80 Y 2 M 29 D Return Phone Number: Address: City/State/Zip: [place] StatisticianClient [facility] Client [facility] Night Physician Tillman AbideLetvak, Richard Contact Type Call Call Type Triage / Clinical Caller Name Charyl Biggerlizabeth Mitchell Relationship To Patient Other Return Phone Number Unavailable Chief Complaint Paging or Request for Consult Initial Comment Caller states that she is [name] at [facility]. She is calling to notify the doctor of a 2.5 pound weight gain. 132 yesterday and is at 134.5 today. Call back # is [phone]. Nurse Assessment Nurse: Sherilyn CooterHenry, RN, Thurmond ButtsWade Date/Time Lamount Cohen(Eastern Time): 06/08/2014 7:19:31 AM Confirm and document reason for call. If symptomatic, describe symptoms. ---Per Massie BougieBelinda, the patient had a weight gain of 2.5lbs in a 24 hour time period. Denies any SOB and chest pains. She states that she is currently on Lasix 40mg /day. She states that the last time this happened, the doctor ordered an extra 20mg  of Lasix. Has  the patient traveled out of the country within the last 30 days? ---Not Applicable Does the patient require triage? ---No Guidelines Guideline Title Affirmed Question Affirmed Notes Nurse Date/Time (Eastern Time) Disp. Time Lamount Cohen(Eastern Time) Disposition Final User 06/08/2014 6:38:36 AM [facility] to [facility] Paging Queue Antonieta IbaHodge, Shannon 06/08/2014 7:03:20 AM Send To Clinical Follow Up Youlanda RoysQueue Hodge, Shannon 06/08/2014 7:22:49 AM Called On-Call Provider Sherilyn CooterHenry, RN, Thurmond ButtsWade 06/08/2014 7:26:11 AM Clinical Call Yes Sherilyn CooterHenry, RN, Thurmond ButtsWade After Care Instructions Given Call Event Type User Date / Time Description Verbal Orders/Maintenance Medications Medication Refill Route Dosage Regime Duration Admin Instructions User Name Furosemide 20mg  Oral Take a one time dose orally. 1 Days Furosemide 20mg  Take a one time dose orally. Sherilyn CooterHenry, RN, Thurmond ButtsWade PLEASE NOTE: All timestamps contained within this report are represented as [place] Standard Time. CONFIDENTIALTY NOTICE: This fax transmission is intended only for the addressee. It contains information that is legally privileged, confidential or otherwise protected from use or disclosure. If you are not the intended recipient, you are strictly prohibited from reviewing, disclosing, copying using or disseminating any of this information or taking any action in reliance on or regarding this information. If you have received this fax in error, please notify us immediately by telephone so that we can arrange for its return to us. Phone: [phone], Toll-Free: [phone], Fax: [phone] Page: 2 of 3 Call Id: [id number] Verbal Orders/Maintenance Medications Medication Refill Route Dosage Regime Duration Admin Instructions User Name #1, 0RF per Dr. Ronna PolioJennifer Walker Comments User: Ronney AstersWade, Henry, RN Date/Time Lamount Cohen(Eastern Time): 06/08/2014 7:25:58 AM 0725- [name] advised of Dr. Tilman NeatWalker's order. She verbalized understanding. Paging DoctorName DoctorPhone DateTime Result/Outcome  Notes Ronna PolioWalker, Jennifer [phone] 06/08/2014 7:22:49 AM Called On Call Provider - Reached Ronna PolioWalker, Jennifer 06/08/2014 7:23:14 AM  Spoke with On Call - General [phone]- I called Dr. Ronna PolioJennifer Walker. Information given. Order rexeived. PLEASE NOTE: All timestamps contained within this report are represented as [place] Standard Time. CONFIDENTIALTY NOTICE: This fax transmission is intended only for the addressee. It contains information that is legally privileged, confidential or otherwise protected from use or disclosure. If you are not the intended recipient, you are strictly prohibited from reviewing, disclosing, copying using or disseminating any of this information or taking any action in reliance on or regarding this information. If you have received this fax in error, please notify us immediately by telephone so that we can arrange for its return to us. Phone: [phone], Toll-Free: [phone], Fax: [phone] Page: 3 of 3 Call Id: [id number] [facility] Call Center [address] [place], [place] [address] [phone] Fax: [phone] MEDICATION ORDER [facility] Night Date: 06/08/2014 From: QI Department To: Tillman AbideLetvak, Richard Please sign the order for the approved drug(s) given by our call center nurse on your behalf. Fax to [phone] within 5 business days. Thank you. Date Lamount Cohen(Eastern Time): 06/08/2014 6:34:30 AM Triage RN: Ronney AstersWade Henry, RN NAME: Madison LoopNANCY Patel PHONE NUMBER: BIRTHDATE: 04-27-1913 ADDRESS: CITY/STATE/ZIP: [place] CALLER: Other NAME: Charyl BiggerElizabeth Mitchell Rx Given Medication Refill Route Dosage Regime Duration Admin Instructions Furosemide 20mg  Oral Take a one time dose orally. 1 Days Furosemide 20mg  time dose orally. #1, Dr. Ronna PolioJennifer Walker MD Signature Date

## 2014-06-14 DIAGNOSIS — F39 Unspecified mood [affective] disorder: Secondary | ICD-10-CM

## 2014-06-14 DIAGNOSIS — I5032 Chronic diastolic (congestive) heart failure: Secondary | ICD-10-CM

## 2014-06-14 DIAGNOSIS — J8410 Pulmonary fibrosis, unspecified: Secondary | ICD-10-CM

## 2014-06-14 DIAGNOSIS — K625 Hemorrhage of anus and rectum: Secondary | ICD-10-CM

## 2014-07-13 DIAGNOSIS — R0602 Shortness of breath: Secondary | ICD-10-CM

## 2014-07-13 DIAGNOSIS — R05 Cough: Secondary | ICD-10-CM

## 2014-08-12 DIAGNOSIS — F39 Unspecified mood [affective] disorder: Secondary | ICD-10-CM

## 2014-08-12 DIAGNOSIS — J8410 Pulmonary fibrosis, unspecified: Secondary | ICD-10-CM

## 2014-08-12 DIAGNOSIS — K648 Other hemorrhoids: Secondary | ICD-10-CM

## 2014-08-12 DIAGNOSIS — M1000 Idiopathic gout, unspecified site: Secondary | ICD-10-CM

## 2014-08-12 DIAGNOSIS — I5030 Unspecified diastolic (congestive) heart failure: Secondary | ICD-10-CM

## 2014-08-12 DIAGNOSIS — I10 Essential (primary) hypertension: Secondary | ICD-10-CM

## 2014-10-14 DIAGNOSIS — F39 Unspecified mood [affective] disorder: Secondary | ICD-10-CM | POA: Diagnosis not present

## 2014-10-14 DIAGNOSIS — J8410 Pulmonary fibrosis, unspecified: Secondary | ICD-10-CM | POA: Diagnosis not present

## 2014-10-14 DIAGNOSIS — K625 Hemorrhage of anus and rectum: Secondary | ICD-10-CM | POA: Diagnosis not present

## 2014-10-14 DIAGNOSIS — I5032 Chronic diastolic (congestive) heart failure: Secondary | ICD-10-CM | POA: Diagnosis not present

## 2014-10-14 NOTE — Consult Note (Signed)
Chief Complaint:  Subjective/Chief Complaint seen for rectal bleeding.  denies abdominalpain, n or v.  No repeat rectal bleeding with bm.  requesting better diet.   VITAL SIGNS/ANCILLARY NOTES: **Vital Signs.:   27-Apr-14 08:47  Vital Signs Type Q 4hr  Temperature Temperature (F) 98  Celsius 36.6  Pulse Pulse 59  Respirations Respirations 20  Systolic BP Systolic BP 161  Diastolic BP (mmHg) Diastolic BP (mmHg) 53  Mean BP 74  Pulse Ox % Pulse Ox % 100  Pulse Ox Activity Level  At rest  Oxygen Delivery 2L  *Intake and Output.:   27-Apr-14 10:06  Stool  soft medium stool   Brief Assessment:  Cardiac Regular   Respiratory clear BS   Gastrointestinal details normal Soft  Nontender  Nondistended  No masses palpable  Bowel sounds normal   Lab Results: Routine Chem:  27-Apr-14 02:40   Result Comment HGB - DUPLICATE ORDER. CBC ORDERED  Result(s) reported on 18 Oct 2012 at 03:11AM.  Glucose, Serum 96  BUN  42  Creatinine (comp) 0.98  Sodium, Serum 141  Potassium, Serum 4.0  Chloride, Serum 107  CO2, Serum 27  Calcium (Total), Serum 9.2  Anion Gap 7  Osmolality (calc) 292  eGFR (African American)  56  eGFR (Non-African American)  48 (eGFR values <109m/min/1.73 m2 may be an indication of chronic kidney disease (CKD). Calculated eGFR is useful in patients with stable renal function. The eGFR calculation will not be reliable in acutely ill patients when serum creatinine is changing rapidly. It is not useful in  patients on dialysis. The eGFR calculation may not be applicable to patients at the low and high extremes of body sizes, pregnant women, and vegetarians.)  Routine Hem:  26-Apr-14 10:55   Hemoglobin (CBC)  11.6    18:48   Hemoglobin (CBC)  10.5 (Result(s) reported on 17 Oct 2012 at 07:07PM.)  27-Apr-14 02:40   Hemoglobin (CBC) -  Hemoglobin (CBC)  10.8  WBC (CBC) 8.7  RBC (CBC)  3.50  Hematocrit (CBC)  31.7  Platelet Count (CBC) 198  MCV 91  MCH 30.9   MCHC 34.1  RDW 14.0  Neutrophil % 75.0  Lymphocyte % 15.3  Monocyte % 7.9  Eosinophil % 1.4  Basophil % 0.4  Neutrophil # 6.5  Lymphocyte # 1.3  Monocyte # 0.7  Eosinophil # 0.1  Basophil # 0.0 (Result(s) reported on 18 Oct 2012 at 03:27AM.)   Assessment/Plan:  Assessment/Plan:  Assessment 1) rectal bleeding likely anal outlet/hemorrhoid versus fissure.  no repeat since admission.   Plan 1) continue rabeprazole 20 mg /d,  anusol cream tid for 10 days, helicobacter pylori serology will need fu as outpatient.   Electronic Signatures: SLoistine Simas(MD)  (Signed 27-Apr-14 12:43)  Authored: Chief Complaint, VITAL SIGNS/ANCILLARY NOTES, Brief Assessment, Lab Results, Assessment/Plan   Last Updated: 27-Apr-14 12:43 by SLoistine Simas(MD)

## 2014-10-14 NOTE — H&P (Signed)
PATIENT NAME:  Madison Patel, Madison Patel MR#:  [id number] DATE OF BIRTH:  1913-05-11  DATE OF ADMISSION:  10/17/2012  REFERRING PHYSICIAN: Lurena Joiner L. Shaune Pollack, MD   PRIMARY CARE PHYSICIAN: Duane Lope. Judithann Sheen, MD  CHIEF COMPLAINT: Bloody stools.   HISTORY OF PRESENT ILLNESS: The patient is a pleasant 81 year old Caucasian female from [place]. She came in after staff found her to have some bright red blood per rectum today. Here on rectal per ER physician, there was an external hemorrhoid as well as some dark bloody stools on exam; however, there is no current active bleeding. On further questioning, the patient stated that she has been having off and on blood in the stool. This morning, it was dripping bright red blood and also she passed 1 clot. Previous to this, the last bloody stool was in mid April. Her hemoglobin appears to be around her baseline. Her previous hemoglobin in February of this year was 11.4 and she presents with hemoglobin of 11.6 today. Hospitalist services were contacted for further evaluation and management.   PAST MEDICAL HISTORY: History of hypertension, gout, hyperlipidemia, congestive heart failure, history of previous stroke.   ALLERGIES: QUININE PER PREVIOUS H AND P.   SOCIAL HISTORY: No smoking, tobacco or alcohol use. Lives at [facility] independent living.   FAMILY HISTORY: Daughter with kidney cancer, mom with cancer as well.   OUTPATIENT MEDICATIONS: Aspirin 325 mg daily, Caduet 10 mg/10 mg 1 tab daily, calcium/vitamin D 1 tab daily, colchicine 0.6 mg 1 tab daily, furosemide 40 mg once a day, Klor-Con 10  extended-release 1 tab once a day, metoprolol tartrate 50 mg once a day, multivitamin 1 tab daily, Premarin 0.625 mg 1 tab once a day.   REVIEW OF SYSTEMS:    CONSTITUTIONAL: Denies fever, weakness, fatigue or weight changes.  EYES: No blurry vision or double vision.  EARS, NOSE, THROAT: No tinnitus. Has chronic rhinorrhea.  RESPIRATORY: Denies cough, wheezing, shortness  of breath or dyspnea on exertion.  CARDIOVASCULAR: Denies chest pain, orthopnea or swelling in the legs. Has high blood pressure.  GASTROINTESTINAL: No nausea, vomiting, diarrhea or abdominal pain. No hematemesis. Positive for bright red blood per rectum as above. Denies history of hemorrhoids.  GENITOURINARY: Denies dysuria or hematuria.  HEMATOLOGIC AND LYMPHATIC: Denies anemia or easy bruising.  SKIN: Denies any rashes.  MUSCULOSKELETAL: Has gout. NEUROLOGIC: History of strokes.  PSYCHIATRIC: Denies anxiety or insomnia.   PHYSICAL EXAMINATION:  VITAL SIGNS: Temperature on arrival was 98.6, pulse 67, respiratory rate 16. Blood pressure on arrival was 198/56, last blood pressure 177/77. O2 saturation 100% on 2 liters. GENERAL: The patient is an elderly Caucasian female lying in bed in no obvious distress, talking in full sentences.  HEENT: Normocephalic, atraumatic. Pupils are equal and reactive. Anicteric sclerae. Poor dentition. Moist mucous membranes.  NECK: Supple. No thyroid tenderness or cervical lymphadenopathy.  CARDIOVASCULAR: S1, S2. No murmurs, rubs or gallops.  LUNGS: Clear to auscultation without wheezing or rhonchi.  ABDOMEN: Soft, nontender. Hyperactive bowel sounds. No rebound or guarding.  EXTREMITIES: No significant lower extremity edema.  NEUROLOGIC: Appears to have grossly intact cranial nerves II through XII. Strength is 5/5 all extremities. Sensation is intact to light touch.  PSYCHIATRIC: Awake, alert, oriented x 2.  SKIN: No obvious rashes.   LABORATORY AND DIAGNOSTIC DATA: Glucose 86, BUN 50, creatinine 0.99, sodium 139, potassium 4.2, chloride 105, GFR of 48 which appears to be around her baseline. Troponin negative. WBC 7.9, hemoglobin 11.6; previous hemoglobin was on February 9  of this year and was 11.4; hematocrit 34.9, platelets 179. EKG: Normal sinus rhythm, left axis deviation, no acute ST elevations or depressions, some T wave inversions in aVL.   ASSESSMENT  AND PLAN: We have a [age] year old female living at independent living facility with history of hypertension and stroke, on aspirin and colchicine, who presents with off-and-on bright red blood per rectum, last episode being today. Likely lower gastrointestinal bleed, diverticular versus hemorrhoidal, without any abdominal pain. Her hemoglobin is stable at this point. The patient's blood pressure is on the higher side. There is no acute bleeding at this point, but this could recur. At this point, we would admit the patient to the hospital and check hemoglobin every 8 hours and obtain a gastroenterology consult. We would hold aspirin and nonsteroidal antiinflammatory drugs. There is no sign of upper gastrointestinal bleed. Would start some gentle intravenous fluid given history of congestive heart failure and follow type and screen. At this point, she does not appear to need a transfusion; however, if she has recurrent bleeding, we would consider getting a bleeding scan and vascular surgical consult as well. Blood pressure is on the higher side. We would decrease the amlodipine to allow for higher blood pressure at this point given acute lower gastrointestinal bleed. We would hold aspirin as stated above and continue the statin for the history of stroke. The patient does appear to have chronic kidney disease, which is stage III and stable per previous labs. We would start the patient on sequential compression devices and TEDs for deep vein thrombosis prophylaxis and proton pump inhibitor as well and start her on clears at this point and await for further gastroenterology input.   CODE STATUS: The patient is a full code at this point.   TOTAL TIME SPENT: 60 minutes. I attempted to call the patient's son, Madison Patel, however, left a message as he did not pick up.   ____________________________ Krystal EatonShayiq Shawnita Krizek, MD sa:jm D: 10/17/2012 12:52:49 ET T: 10/17/2012 13:43:52 ET JOB#: [id number]  cc: Krystal EatonShayiq Brittan Butterbaugh, MD,  <Dictator> Duane LopeJeffrey D. Judithann SheenSparks, MD Krystal EatonSHAYIQ Donoven Pett MD ELECTRONICALLY SIGNED 11/24/2012 12:26

## 2014-10-14 NOTE — Discharge Summary (Signed)
PATIENT NAME:  Madison Patel, Madison Patel MR#:  [id number] DATE OF BIRTH:  30-Jun-1912  DATE OF ADMISSION:  10/17/2012 DATE OF DISCHARGE:  10/21/2012  REASON FOR ADMISSION: Lower GI bleeding.   HISTORY OF THE PRESENT ILLNESS: Please see the dictated HPI done by Dr. Jacques NavyAhmadzia on 10/17/2012.   PAST MEDICAL HISTORY: 1.  Benign hypertension.  2.  Gout.  3.  Hyperlipidemia.  4.  Chronic systolic congestive heart failure.  5.  Previous stroke.   MEDICATIONS ON ADMISSION: Please see admission note.   ALLERGIES: QUININE.   SOCIAL HISTORY, FAMILY HISTORY AND REVIEW OF SYSTEMS: As per admission note.   PHYSICAL EXAM: The patient was in no acute distress.  VITAL SIGNS: Were stable, and she was afebrile.  HEENT EXAM: Was unremarkable.  NECK: Was supple, without JVD.  LUNGS: Were clear.  CARDIAC EXAM: Revealed a regular rate and rhythm, with a normal S1 and S2.  ABDOMEN: Soft, nontender, with, hyperactive bowel sounds.  EXTREMITIES: Without edema.  NEUROLOGIC EXAM: Was grossly nonfocal.   HOSPITAL COURSE: The patient was admitted with lower GI bleeding and anemia. Her aspirin and nonsteroidal anti-inflammatory drugs were held. She was started on some IV fluids. She was seen in consultation by gastroenterology who felt that her GI bleeding was rectal, from hemorrhoids. During the hospitalization, Helicobacter pylori serology returned positive, and she was started on amoxicillin and Biaxin. She was also maintained on AcipHex in her hospitalization. No colonoscopy was to be done by GI due to the patient's age and comorbidities. Her rectal bleeding improved, and she remained asymptomatic. Her hemoglobin stabilized. By 10/21/2012, the patient was stable and ready for discharge.   DISCHARGE DIAGNOSES: 1.  Lower gastrointestinal bleeding.  2.  Anemia.  3.  Internal hemorrhoids.  4.  Helicobacter pylori gastritis.  5.  Benign hypertension.  6.  Previous stroke.  7.  Chronic systolic congestive heart failure.    DISCHARGE MEDICATIONS: 1.  Toprol-XL 25 mg p.o. daily.  2.  Lipitor 10 mg p.o. daily.  3.  Norvasc 10 mg p.o. daily.  4.  Zoloft 25 mg p.o. daily.  5.  Advair 250/50, 1 puff b.i.d.  6.  Os-Cal D, 1 p.o. b.i.d.  7.  Multivitamin 1 p.o. daily.  8.  Colchicine 0.6 mg p.o. daily.  9.  Hydrocortisone cream, applied to rectum t.i.d.  10.  Biaxin 500 mg p.o. b.i.d. x 10 days.  11.  Amoxicillin 1000 mg p.o. b.i.d. x 10 days.  12.  AcipHex 20 mg p.o. daily.  13.  Oxygen at 2 liters per minute per nasal cannula.   FOLLOWUP PLANS AND APPOINTMENTS: The patient was discharged home with home health. She will be seen by physical therapy and nursing. She is on oxygen. She is on a low-sodium diet. She will follow up in with me in 1 to 2 weeks; sooner if needed.    ____________________________ Duane LopeJeffrey D. Judithann SheenSparks, MD jds:dm D: 10/25/2012 13:19:01 ET T: 10/25/2012 15:42:54 ET JOB#: [id number]  cc: Duane LopeJeffrey D. Judithann SheenSparks, MD, <Dictator> JEFFREY Rodena Medin SPARKS MD ELECTRONICALLY SIGNED 10/25/2012 21:24

## 2014-10-14 NOTE — Consult Note (Signed)
Chief Complaint:  Subjective/Chief Complaint seen for hematochezia.  no repeat since admission.  no abd pain nausea or emesis.  bm this am "large, normal" without blood.   VITAL SIGNS/ANCILLARY NOTES: **Vital Signs.:   28-Apr-14 17:24  Vital Signs Type Q 4hr  Temperature Temperature (F) 97.7  Celsius 36.5  Temperature Source oral  Pulse Pulse 93  Respirations Respirations 20  Systolic BP Systolic BP 150  Diastolic BP (mmHg) Diastolic BP (mmHg) 61  Mean BP 90  Pulse Ox % Pulse Ox % 95  Pulse Ox Activity Level  At rest  Oxygen Delivery Room Air/ 21 %  *Intake and Output.:   28-Apr-14 11:55  Stool  large normal   Brief Assessment:  Cardiac Regular   Respiratory clear BS   Gastrointestinal details normal Soft  Nontender  Nondistended  No masses palpable  Bowel sounds normal   Lab Results: Routine Hem:  26-Apr-14 10:55   Hemoglobin (CBC)  11.6    18:48   Hemoglobin (CBC)  10.5 (Result(s) reported on 17 Oct 2012 at 07:07PM.)  27-Apr-14 02:40   Hemoglobin (CBC) -  Hemoglobin (CBC)  10.8    14:28   Hemoglobin (CBC)  10.6 (Result(s) reported on 18 Oct 2012 at 03:08PM.)  28-Apr-14 04:52   Hemoglobin (CBC)  9.9 (Result(s) reported on 19 Oct 2012 at 05:43AM.)    15:48   Hemoglobin (CBC)  9.7 (Result(s) reported on 19 Oct 2012 at 04:36PM.)   Radiology Results: XRay:    28-Apr-14 11:02, Chest PA and Lateral  Chest PA and Lateral   REASON FOR EXAM:    hypoxia  COMMENTS:       PROCEDURE: DXR - DXR CHEST PA (OR AP) AND LATERAL  - Oct 19 2012 11:02AM     RESULT: Comparison is made to the study of Nov 07, 2010.    The lungs are hyperinflated. There is blunting of the lateral and   posterior costophrenic gutters on the right and of the posterior   costophrenic gutter on the left. The cardiac silhouette is top normal in   size. The pulmonary vascularity is not clearly engorged. The pulmonary   interstitial markings are increased in a fashion that may reflect   subsegmental  atelectasis or mild interstitial edema. The bones are   osteopenic.  IMPRESSION:  The findings are consistent with COPD. I cannot exclude   superimposed low-grade CHF in the appropriate clinical setting. In  addition early interstitial pneumonia could be present.     Dictation Site: 2        Verified By: DAVID A. SwazilandJORDAN, M.D., MD   Assessment/Plan:  Assessment/Plan:  Assessment 1) hematochezia-not recurrent in hospital 2) multiple medical issues including h/o copd, chf   Plan 1) awaiting results of helicobacte serologies 2) continue rabeprazole 3) finish 10 day course of analpram cream.  if there is a repeat episode of bleeding would consider a flex sig.   following.   Electronic Signatures: Barnetta ChapelSkulskie, Kree Rafter (MD)  (Signed 28-Apr-14 18:28)  Authored: Chief Complaint, VITAL SIGNS/ANCILLARY NOTES, Brief Assessment, Lab Results, Radiology Results, Assessment/Plan   Last Updated: 28-Apr-14 18:28 by Barnetta ChapelSkulskie, Kelise Kuch (MD)

## 2014-10-14 NOTE — Consult Note (Signed)
Chief Complaint:  Subjective/Chief Complaint seen for hematochezia.  no repeat rectal bleeding since admission.  denies abdominal pain or nausea.  good appetite.   VITAL SIGNS/ANCILLARY NOTES: **Vital Signs.:   29-Apr-14 14:36  Vital Signs Type Routine  Temperature Temperature (F) 98  Celsius 36.6  Temperature Source oral  Pulse Pulse 66  Respirations Respirations 20  Systolic BP Systolic BP 741  Diastolic BP (mmHg) Diastolic BP (mmHg) 68  Mean BP 92  Pulse Ox % Pulse Ox % 96  Pulse Ox Activity Level  At rest  Oxygen Delivery Room Air/ 21 %   Brief Assessment:  Cardiac Regular   Respiratory clear BS   Gastrointestinal details normal Soft  Nontender  Nondistended  No masses palpable  Bowel sounds normal   Lab Results: General Ref:  [id number] 46:80   Helicobacter pylori AB. IgG, IgA, IgM ========== TEST NAME ==========  ========= RESULTS =========  = REFERENCE RANGE =  HELICOBACTER P. AB PANEL  H pylori, IgM, IgG, IgA Ab H. pylori, IgG Abs              [H  2.6 U/mL             ]           0.0-0.8             Negative            <0.9                                             Indeterminate  0.9 - 1.0                                             Positive            >1.0 H. pylori, IgA Abs              [H  12.6 units           ]           0.0-8.9                                                Negative          <9.0                                                Equivocal   9.0 - 11.0                                                Positive         >11.0 H. pylori, IgM Abs              [   <9.0 units           ]  0.0-8.9                                                Negative          <9.0                                                Equivocal   9.0 - 11.0                                                Positive>11.0                                                                      .                This test was developed and its performance  characteristics determined by [facility]. It has not been                cleared or approved by the Food and Drug Administration.                Results of this test are for investigational purposes                only. The result should not be used as a diagnostic                procedure without confirmation of the diagnosis by  another medically diagnostic product or procedure.               [facility]            No: [id number]           [address], [place], [place] [address]           Lindon Romp, MD         [phone]   Result(s) reported on28 Apr 2014 at 09:17PM.  Routine Chem:  29-Apr-14 05:28   Glucose, Serum 92  BUN  30  Creatinine (comp) 0.90  Sodium, Serum 143  Potassium, Serum 3.5  Chloride, Serum  110  CO2, Serum 28  Calcium (Total), Serum 8.8  Anion Gap  5  Osmolality (calc) 291  eGFR (African American) >60  eGFR (Non-African American)  54 (eGFR values <49m/min/1.73 m2 may be an indication of chronic kidney disease (CKD). Calculated eGFR is useful in patients with stable renal function. The eGFR calculation will not be reliable in acutely ill patients when serum creatinine is changing rapidly. It is not useful in  patients on dialysis. The eGFR calculation may not be applicable to patients at the low and high extremes of body sizes, pregnant women, and vegetarians.)  Routine Hem:  26-Apr-14 10:55   Hemoglobin (CBC)  11.6    18:48   Hemoglobin (CBC)  10.5 (Result(s) reported on 17 Oct 2012 at  07:07PM.)  27-Apr-14 02:40   Hemoglobin (CBC) -  Hemoglobin (CBC)  10.8    14:28   Hemoglobin (CBC)  10.6 (Result(s) reported on 18 Oct 2012 at 03:08PM.)  28-Apr-14 04:52   Hemoglobin (CBC)  9.9 (Result(s) reported on 19 Oct 2012 at 05:43AM.)    15:48   Hemoglobin (CBC)  9.7 (Result(s) reported on 19 Oct 2012 at 04:36PM.)  29-Apr-14 05:28   WBC (CBC) 8.3  RBC (CBC)  3.19  Hemoglobin (CBC)  9.7  Hematocrit (CBC)  28.8  Platelet Count (CBC) 162   MCV 90  MCH 30.3  MCHC 33.6  RDW 13.9  Neutrophil % 70.4  Lymphocyte % 19.2  Monocyte % 9.2  Eosinophil % 0.9  Basophil % 0.3  Neutrophil # 5.8  Lymphocyte # 1.6  Monocyte # 0.8  Eosinophil # 0.1  Basophil # 0.0 (Result(s) reported on 20 Oct 2012 at 07:08AM.)   Assessment/Plan:  Assessment/Plan:  Assessment 1) hematochezia-possible anal outlet /hemorrhoidal, not recurrent, currently treating with anal pram hc med.  2) positive H pylori serology.   Plan 1) finish 10 day course of anal pram tid. 2) h. pylori treatment of rabeprazole 20 mg po bid for 10 days, biaxin 500 mg po bid for 10 days, amoxicillin 1075m po bid for 10 days.  Continue PPI (rabeprazole) daily for at lear 6 weeks t maximize response.  Rabeprazole used due to efficacy profile in elderly patients.   Electronic Signatures: SLoistine Simas(MD)  (Signed 29-Apr-14 17:34)  Authored: Chief Complaint, VITAL SIGNS/ANCILLARY NOTES, Brief Assessment, Lab Results, Assessment/Plan   Last Updated: 29-Apr-14 17:34 by SLoistine Simas(MD)

## 2014-10-14 NOTE — Consult Note (Signed)
PATIENT NAME:  Madison Patel, Madison Patel MR#:  [id number] DATE OF BIRTH:  [birth year]  DATE OF CONSULTATION:  10/17/2012  CONSULTING PHYSICIAN:  Christena Deem, MD  Patient of Dr. Krystal Eaton  REASON FOR CONSULTATION:  Lower GI bleeding.   HISTORY OF PRESENT ILLNESS:  The patient is a pleasant, 81 year old Caucasian female, who was brought to the Emergency Room this morning after a couple of episodes of bright red blood noted on the toilet paper and associated with a stool. She does have a history of some constipation. However, her bowel habits are quite variable. She states she may go up to a week and not have a bowel movement. She has been placed on MiraLAX in the past. However, when she takes it daily or even every other day, she loses continence. She does not know that she has ever had a colonoscopy. When she was seen in the Emergency Room by the ER physician, there was apparently an external hemorrhoid as well as some dark-appearing bloodier stools, but apparently no active bleeding. The patient stated that this morning, what she passed was bright red blood and a small clot. Her hemoglobin has been stable in comparison with previous hospitalization. She states that she will see some blood on the toilet paper occasionally, but this seemed to be more than usual. She stated that the last time she saw any blood on the toilet paper was on 10/09/2012. She has never been treated for hemorrhoids. She denies any problems with nausea, vomiting or abdominal pain. There is no heartburn or dysphagia.   PAST MEDICAL HISTORY: She has a history of gout, hyperlipidemia, congestive heart failure, history of stroke. She also has a history of hypertension.   SOCIAL HISTORY:  She does not use alcohol or tobacco. She lives at [facility] independent living.   GASTROINTESTINAL FAMILY HISTORY:  Negative for colorectal cancer, liver disease or ulcers.   REVIEW OF SYSTEMS:  Systems reviewed per admission history and  physical, agree with same.   PHYSICAL EXAMINATION: VITAL SIGNS: Temperature 97.6, pulse 62, respirations 18, blood pressure 162/54, pulse ox 99.  GENERAL:  She is a 81 year old Caucasian female in no acute distress. She was sitting in her room reading a book when I came in.  HEENT:  Normocephalic, atraumatic. Eyes are anicteric. Nose: Septum midline. Oropharynx: No lesions.  NECK:  No JVD.  HEART:  Regular rate and rhythm.  LUNGS:  Clear.  ABDOMEN:  Soft, nontender, nondistended. Bowel sounds positive, normoactive.  ANORECTAL EXAMINATION: Shows a very dark green stool, though not black, that is brightly Hemoccult positive. I did not feel any overt hemorrhoid, although it is possible that the specimen was contaminated on rectal examination.   LABORATORIES INCLUDE THE FOLLOWING: On admission to the hospital, she had a glucose of 86, BUN 50, creatinine 0.99, sodium 139, potassium 4.2, chloride 105, bicarb 28, calcium 9.7. Troponin I less than 0.02. Hemogram showing a white count of 7.9, H and H 11.6/34.9, platelet count of 179, MCV is 91. There was no imaging study done.   CURRENT MEDICATIONS INCLUDE:  Aspirin 325 mg a day, Caduet 10/10 mg once a day,  calcium/vitamin D once a day, Colchicine 0.6 mg once a day, Lasix 40 mg once a day, Klor-Con 10 mg once a day, metoprolol tartrate 50 mg a day, multiple vitamin, Premarin 0.625 mg once a day.   ALLERGIES:  She has no known drug allergies.   ASSESSMENT:   1.  Rectal bleeding. The patient is Hemoccult positive  on check. It was bright red rectal bleeding, however, that brought the patient in. There is no evidence of bright red rectal bleeding currently. The stool in the vault is soft and very dark green, and brightly Hemoccult positive. The patient does take a full dose aspirin daily, and is not on any type of medication in regards to gastric preventative for ulcer. Oftentimes, at this age, there is minimal acid production in the stomach. However, in the  setting of the dark stools, it may be advisable for her to be on a proton pump inhibitor. Currently she is hemodynamically stable, and there has been no further passage of any bright red material or bright red bleeding from the rectum.   RECOMMENDATIONS: 1.  Will obtain a Helicobacter pylori serology.   2.  Recheck hemoglobin in 12 hours. It is of note that the patient, in review of her labs over the past 2 years, carries an elevated BUN, and her hemoglobin is relatively stable over that time.   3.  Would give her an external treatment for anal outlet bleeding, such as Analpram-HC 2.5% cream to be applied t.i.d.   Will follow her clinically.    ____________________________ Christena DeemMartin U. Marie Chow, MD mus:mr D: 10/17/2012 18:43:32 ET T: 10/17/2012 19:18:03 ET JOB#: [id number]  cc: Christena DeemMartin U. Jacole Capley, MD, <Dictator> Christena DeemMARTIN U Pink Maye MD ELECTRONICALLY SIGNED 11/03/2012 13:02

## 2014-10-14 NOTE — Consult Note (Signed)
Brief Consult Note: Diagnosis: hematochezia.   Patient was seen by consultant.   Consult note dictated.   Recommend further assessment or treatment.   Orders entered.   Comments: Please see full GI consult.  Patient admitted with c/o bright red hematochezia.  Seen mostly on the toilet paper.  Not recurrent since admission.  On hemoccult she shows a very dark green stool that is brightly heme positive.  Patietn on ASA.  Recommend conservative treatment as she is hemodynamically stable and hgb without drop compared to previous.  Will check h. pylori serology, institute rebeprazole 20 mg po daily, analpram hc 2.5 % cream applied externally tid for 10 days.  Following.  Electronic Signatures: Barnetta ChapelSkulskie, Maika Kaczmarek (MD)  (Signed 26-Apr-14 18:48)  Authored: Brief Consult Note   Last Updated: 26-Apr-14 18:48 by Barnetta ChapelSkulskie, Roshunda Keir (MD)

## 2014-10-15 NOTE — H&P (Signed)
Subjective/Chief Complaint Rectal bleeding   History of Present Illness 81 year old lady presented with c/o two episodes of bright red bleeding per rectum yesterday. No c/o abdominal pain, nausea, vomiting or diarrhea. No dizziness or fall.   Past History Chronic anemia Hypertension Hyperlipidemia   Primary Physician Dr. Doy Hutching   Past Med/Surgical Hx:  Pneumonia:   Hypercholesterolemia:   CVA:   Gout:   HTN:   Cholecystectomy:   Tonsillectomy:   Carpal Tunnel Release:   hysterectomy:   ALLERGIES:  No Known Allergies:   Family and Social History:  Family History Coronary Artery Disease  Hypertension  Gastric cancer   Social History negative tobacco, negative ETOH   Place of Living [facility]   Review of Systems:  Fever/Chills No   Cough No   Abdominal Pain No   Nausea/Vomiting No   Chest Pain No   Dysuria No   Physical Exam:  GEN disheveled   HEENT PERRL   NECK supple  No masses  trachea midline   RESP normal resp effort  clear BS  no use of accessory muscles   CARD regular rate  no JVD   ABD denies tenderness  soft  normal BS   LYMPH negative neck   EXTR negative edema   SKIN normal to palpation   NEURO cranial nerves intact   PSYCH alert, good insight   Lab Results: Hepatic:  18-Jul-15 08:02   Bilirubin, Total 0.2  Alkaline Phosphatase  143 (45-117 NOTE: New Reference Range 05/14/13)  SGPT (ALT) 20  SGOT (AST) 31  Total Protein, Serum 6.9  Albumin, Serum  3.2  Routine BB:  18-Jul-15 08:02   ABO Group + Rh Type O Positive  Antibody Screen NEGATIVE (Result(s) reported on 08 Jan 2014 at 09:49AM.)  Routine Chem:  18-Jul-15 08:02   Glucose, Serum 93  BUN  41  Creatinine (comp) 1.18  Sodium, Serum 142  Potassium, Serum 4.3  Chloride, Serum  108  CO2, Serum 26  Calcium (Total), Serum 9.2  Osmolality (calc) 293  eGFR (African American)  44  eGFR (Non-African American)  38 (eGFR values <26m/min/1.73 m2 may be an indication of  chronic kidney disease (CKD). Calculated eGFR is useful in patients with stable renal function. The eGFR calculation will not be reliable in acutely ill patients when serum creatinine is changing rapidly. It is not useful in  patients on dialysis. The eGFR calculation may not be applicable to patients at the low and high extremes of body sizes, pregnant women, and vegetarians.)  Anion Gap 8  Routine UA:  18-Jul-15 08:01   Color (UA) Yellow  Clarity (UA) Cloudy  Glucose (UA) Negative  Bilirubin (UA) Negative  Ketones (UA) Negative  Specific Gravity (UA) 1.011  Blood (UA) Negative  pH (UA) 6.0  Protein (UA) 100 mg/dL  Nitrite (UA) Negative  Leukocyte Esterase (UA) 3+ (Result(s) reported on 08 Jan 2014 at 09:04AM.)  RBC (UA) 21 /HPF  WBC (UA) 755 /HPF  Bacteria (UA) NONE SEEN  Epithelial Cells (UA) NONE SEEN  Result(s) reported on 08 Jan 2014 at 09:04AM.  Routine Coag:  18-Jul-15 08:02   Prothrombin 13.0  INR 1.0 (INR reference interval applies to patients on anticoagulant therapy. A single INR therapeutic range for coumarins is not optimal for all indications; however, the suggested range for most indications is 2.0 - 3.0. Exceptions to the INR Reference Range may include: Prosthetic heart valves, acute myocardial infarction, prevention of myocardial infarction, and combinations of aspirin and  anticoagulant. The need for a higher or lower target INR must be assessed individually. Reference: The Pharmacology and Management of the Vitamin K  antagonists: the seventh ACCP Conference on Antithrombotic and Thrombolytic Therapy. KWIOX.7353 Sept:126 (3suppl): [id number]. A HCT value >55% may artifactually increase the PT.  In one study,  the increase was an average of 25%. Reference:  "Effect on Routine and Special Coagulation Testing Values of Citrate Anticoagulant Adjustment in Patients with High HCT Values." American Journal of Clinical Pathology 2006;126:400-405.)  Routine  Hem:  18-Jul-15 08:02   WBC (CBC) 7.1  RBC (CBC)  3.38  Hemoglobin (CBC)  9.1  Hematocrit (CBC)  28.4  Platelet Count (CBC) 212 (Result(s) reported on 08 Jan 2014 at 08:48AM.)  MCV 84  MCH 26.8  MCHC  31.9  RDW 14.0    Assessment/Admission Diagnosis 81 year old lady with h/o hypertension and hyperlipidemia admitted for bright red bleeding per rectum.   Plan Rectal bleeding: patient is hemodynamically stable, H/H 9.1/28.4, normal creatinine and PT/INR on initial ED labs, continue i.v fluids, monitor H/H, start Pantoprazole. GI consult and colonoscopy.   Chronic normocytic normochromic anemia: obtain iron studies with AM labs. CXR today.    Hypertension: continue Norvasc, Benicar, HCTZ, Toprol, Lasix and K-Dur.  Hyperlipidemia: continue Atorvastatin 10 mg daily.  Initial labs: +cloudy urine, obtain urine c/s and repeat u/a.   Electronic Signatures: Glendon Axe (MD)  (Signed [phone] 17:23)  Authored: CHIEF COMPLAINT and HISTORY, PAST MEDICAL/SURGIAL HISTORY, ALLERGIES, FAMILY AND SOCIAL HISTORY, REVIEW OF SYSTEMS, PHYSICAL EXAM, LABS, ASSESSMENT AND PLAN   Last Updated: 18-Jul-15 17:23 by Glendon Axe (MD)

## 2014-10-15 NOTE — Consult Note (Signed)
Pt seen and examined. Please see Madison Patel's notes. Pt with family hx of colon cancer. No prior colonoscopy. Pt interested in finding out the cause of her bleeding and anemia. Wants to make it to 100. Pt at increased risk due to her age. Not sure if patient can be prepped adequately. Nevertheless, will proceed with colonoscopy if family also agrees. Son not available right now.   Electronic Signatures: Lutricia Feilh, Bandy Honaker (MD)  (Signed on 20-Jul-15 15:52)  Authored  Last Updated: 20-Jul-15 15:52 by Lutricia Feilh, Rindi Beechy (MD)

## 2014-10-15 NOTE — Discharge Summary (Signed)
PATIENT NAME:  Madison Patel, POUNDS MR#:  [id number] DATE OF BIRTH:  1913/04/17  DATE OF ADMISSION:  03/14/2014 DATE OF DISCHARGE:  03/17/2014  TYPE OF DISCHARGE:  The patient transferred to a skilled nursing facility.   REASON FOR ADMISSION: Shortness of breath.   HISTORY OF PRESENT ILLNESS: The patient is a 81 year old female who resides at [facility] with a history of hypertension, hyperlipidemia and previous stroke. Came in to the Emergency Room with worsening shortness of breath. In the Emergency Room, the patient showed pleural effusions and questionable pneumonia. She was admitted for further evaluation.   PAST MEDICAL HISTORY:  1.  Previous stroke.  2.  Benign hypertension.  3.  Hyperlipidemia  4.  Gout.  5.  History of pneumonia.  6.  History of congestive heart failure.   MEDICATIONS ON ADMISSION: Please see admission note.   ALLERGIES: No known drug allergies.   SOCIAL HISTORY:  Negative for alcohol or tobacco abuse.   FAMILY HISTORY:  Positive for coronary artery disease, hypertension, gastric cancer.   REVIEW OF SYSTEMS:  As per HPI.  PHYSICAL EXAMINATION:  GENERAL: The patient was in no acute distress.  VITAL SIGNS: Stable and she was afebrile.  HEENT: Unremarkable.  NECK: Supple without JVD.  LUNGS: Revealed decreased breath sounds with basilar crackles.  CARDIAC: Revealed a regular rate and rhythm. Normal S1, S2.  ABDOMEN: Soft and nontender.  EXTREMITIES: Without edema.  NEUROLOGIC: Grossly nonfocal.   HOSPITAL COURSE: The patient was admitted with mild worsening congestive heart failure and anemia. She was also felt to have underlying chronic lung scarring and pulmonary fibrosis. She ruled out for a MI by serial cardiac enzymes. She was placed on oxygen with DuoNeb SVNs. She was also given IV Lasix. Echocardiogram was performed and revealed an ejection fraction of 70-75%. Her anemia was felt to be contributing to her heart failure. She was given 1 unit of packed red  blood cells with improvement of her anemia. Her shortness of breath improved. Physical therapy saw the patient and recommended skilled nursing. The family agreed. She is now transferred to the skilled nursing facility for further care and rehabilitation.   DISCHARGE DIAGNOSES:  1.  Acute on chronic congestive heart failure.  2.  Worsening anemia.  3.  Pulmonary fibrosis.  4.  Chronic respiratory failure.  5.  Urinary tract infection.  6.  Benign hypertension.  7.  Renal insufficiency.   DISCHARGE MEDICATIONS:  1.  Aspirin 81 mg p.o. daily.  2.  Norvasc 10 mg p.o. daily.  3.  Lipitor 10 mg p.o. at bedtime.  4.  Colchicine 0.6 mg p.o. daily.  5.  Toprol XL 50 mg p.o. daily.  6.  Benicar 40 mg p.o. daily.  7.  Zoloft 50 mg p.o. at bedtime.  8.  Protonix 40 mg p.o. daily.  9.  Albuterol 2 puffs every four hours p.r.n. shortness of breath.  10. ProctoCream HC to rectum t.i.d.  11. Symbicort 160/4.5, 2 puffs b.i.d.  12. Lasix 40 mg p.o. daily.  13. Klor-Con 20 mEq p.o. daily.  14. Levaquin 250 mg p.o. daily x 1 week.  15.  DuoNeb SVNs q.i.d.  16.  Oxygen at 2 liters per minute per nasal cannula.   FOLLOWUP PLANS AND APPOINTMENTS: The patient will be followed by the resident physician at the skilled nursing facility. She is a no code, do no resuscitate. She is on a 2-gram sodium diet. She will be seen in consultation by physical therapy. Will obtain a  CBC and a MET-B in 1 week.    ____________________________ Leonie Douglas. Doy Hutching, MD jds:lt D: 03/17/2014 06:46:00 ET T: 03/17/2014 07:02:30 ET JOB#: [id number]  cc: Leonie Douglas. Doy Hutching, MD, <Dictator> Estephany Perot Lennice Sites MD ELECTRONICALLY SIGNED 03/18/2014 8:46

## 2014-10-15 NOTE — Consult Note (Signed)
PATIENT NAME:  Madison Patel, Madison Patel MR#:  [id number] DATE OF BIRTH:  1912/08/14  DATE OF CONSULTATION:  01/10/2014  REFERRING PHYSICIAN:  Dr. Fulton Reek CONSULTING PHYSICIAN:  Payton Emerald, NP  REASON FOR CONSULTATION: Rectal bleeding, nausea and vomiting.   HISTORY OF PRESENT ILLNESS: Madison Patel is a 81 year old Caucasian female who presented to the hospital on January 08, 2014 for the concern of rectal bleeding. She apparently had 2 episodes of bright red blood per rectum yesterday. No complaint of abdominal pain initially on admission and remains denying pain, but does state that it is more of a tightness sensation across the upper abdomen with associated nausea. No vomiting at this time, although she goes back and states that she has recently had nausea and vomiting, but unable to recall timeframe as well as she is unable to recall when the rectal bleeding started. The patient had gotten up, ambulated to the bathroom, prior to interviewing process with a walker. According to nursing aide, very small, about a pea size amount of bright red blood noted with feces. She appears to be resting comfortably in bed. Her hemoglobin has been on average in the 9 range during her hospitalization. She has not received any blood transfusion. Vitamin B12 level was 1,061. Chest x-ray revealed COPD. She is currently on a clear liquid diet. She denies ever having symptoms similar to this in the past. Denies any history of colonoscopy in the past. No heartburn or reflux. Weight is stable. No evidence of melena.   HOME MEDICATIONS: Advair Diskus 250 mcg/50 mcg 1 inhalation once a day, albuterol 2 inhalations a day, Norvasc 10 mg once a day, aspirin 81 mg daily, atorvastatin 10 mg once a day, Benicar with hydrochlorothiazide 12.5/40 mg 1 tablet once a day, calcium plus vitamin D 1 daily, colchicine 0.6 mg once a day, furosemide 40 mg once a day, hydrocortisone 1 application rectally 3 times daily, metoprolol succinate ER 25  mg 1 tablet extended release once a day, Mucinex 1200 mg every 12 hours, potassium chloride extended release 20 mEq once a day, AcipHex 20 mg once a day, Zoloft 25 mg once a day, Tylenol 650 mg 4 times daily as needed, women's multivitamin 50+ once a day.   ALLERGIES: No none.   PAST MEDICAL HISTORY: Hyperlipidemia, hypertension, chronic anemia, COPD, pneumonia, CBA, gout.  PAST SURGICAL HISTORY: Tonsillectomy, cholecystectomy, carpal tunnel release, hysterectomy.   FAMILY HISTORY: Mother with history of colon cancer. Daughter history of renal cancer. No family history of colonic polyps.   SOCIAL HISTORY: No tobacco, no alcohol use. Resides at [facility].   REVIEW OF SYSTEMS:  CONSTITUTIONAL: Denies any fevers, chills.   GASTROINTESTINAL: See HPI. GENITOURINARY: No dysuria, hematuria or nocturia.   PULMONARY: No coughing. No wheezing.  CARDIOVASCULAR: Denies any chest pain, heart palpitations or skipping beats. Ten systems reviewed and unremarkable other than what is stated above.   PHYSICAL EXAMINATION: VITAL SIGNS: Temperature is 98.2 with a pulse of 58, respirations are 16, blood pressure is 159/61 with a pulse ox of 99% on room air.  GENERAL: Well-developed, well-nourished 81 year old Caucasian female, no acute distress noted, pleasant. Resting comfortably in bed. Hard of hearing.  HEENT: Normocephalic, atraumatic. Pupils equal and reactive to light. Conjunctivae clear. Sclerae anicteric.  NECK: Supple. Trachea midline. No lymphadenopathy or thyromegaly.  PULMONARY: Symmetric rise and fall of chest. Clear to auscultation throughout.  CARDIOVASCULAR: Regular rhythm, S1 and S2. No murmurs. No gallops.  ABDOMEN: Soft, nondistended. Bowel sounds in all 4 quadrants.  RECTAL: Deferred.  MUSCULOSKELETAL: Movement of all 4 extremities. No contractures. No clubbing.  EXTREMITIES: No edema.  PSYCHIATRIC: Alert and oriented x4. Some slow memory recall, but overall seems to answer  appropriately.  NEUROLOGIC: No gross neurological deficits.   DIAGNOSTIC DATA: All laboratory/diagnostic study results during her hospitalization reviewed by myself. Of importance is noted EGFR low in the range of 34 to 42. Ferritin level is low at 8. Iron saturation is 7% with TIBC of 518. Iron level is 36. Hepatic panel on admission: Alkaline phosphatase elevated at 143, albumin 3.2. Repeated albumin 3.1 with alk phos of 141. CBC on admission: Hemoglobin was 9.1 with hematocrit of 28.4. Currently hemoglobin 9.1 with a hematocrit of 28.5. Antibody screen is negative. ABO group plus Rh type is O positive. PT is 13.1 with an INR on admission and yesterday 12.9 with an INR of 1. Urinalysis: Protein is 100 mg/dL, leukocytes are +3, RBC is 21 per high-power field with WBC of 755 per high-power field. Improvement noted yesterday in urine results.   IMPRESSION:  1.  Lower gastrointestinal bleed. 2.  Iron deficiency anemia.  3.  Known history of chronic anemia. 4.  Hypertension.  5.  Family history of colon cancer involving first degree relative.   PLAN: The patient's presentation was discussed with Dr. Verdie Shire. We will consider proceeding forward with a colonoscopy tomorrow for the indication of lower gastrointestinal bleed. Procedure versus risks and benefits discussed with the patient. Requiring consent from her son to be obtained first prior to procedure being done. Order is placed for tentatively proceeding tomorrow. Will continue to monitor, specifically monitor hemoglobin and hematocrit. Recommend transfusing as necessary.   These services provided by Payton Emerald, MS, APRN, [facility], FNP under collaborative agreement with Verdie Shire, MD. ____________________________ Payton Emerald, NP dsh:sb D: 01/10/2014 15:37:13 ET T: 01/10/2014 16:05:00 ET JOB#: [id number]  cc: Payton Emerald, NP, <Dictator> Payton Emerald MD ELECTRONICALLY SIGNED 01/11/2014 9:44

## 2014-10-15 NOTE — H&P (Signed)
PATIENT NAME:  Madison Patel, Madison Patel MR#:  [id number] DATE OF BIRTH:  [birth year]  DATE OF ADMISSION:  03/14/2014  PRIMARY CARE PHYSICIAN: Duane Lope. Judithann Sheen, MD   REFERRING PHYSICIAN: Coolidge Breeze, MD   CHIEF COMPLAINT: Shortness of breath with exertion.   HISTORY OF PRESENT ILLNESS: [name] is a 81 year old female who is a nursing home resident, has a history of hypertension, hyperlipidemia, previous history of CVA, who comes to the Emergency Department with the complaints of shortness of breath of 1 month duration. The patient states the shortness of breath has been gradually getting worse. The patient was unable to walk even a few steps kicking, shortness of breath. Concerning this, the patient was brought to the Emergency Department. In the Emergency Department, the patient was found to have a blood pressure of 180/84. The patient denied having any chest pain, palpitations. The patient also states that has gained about 5 pounds of weight in the last 1 month. The chest x-ray in the Emergency Department shows mild patchy bilateral opacities, likely atelectasis; small bilateral pleural effusions, right greater than the left. Did not obtain any BNP, as the patient has chronic renal insufficiency. The patient is also found to have urinary tract infection with 3+ leukocyte esterase, 67 WBC, and a bacteria of 2+. The patient was found to have fever of 99. The patient has a chronic anemia of 7.5.   PAST MEDICAL HISTORY:  1.  Hypertension.  2.  Hyperlipidemia.  3.  Previous history of CVA.  4.  Gout.  5.  Previous history of pneumonia.  6.  History of congestive heart failure.   PAST SURGICAL HISTORY: 1.  Cholecystectomy. 2.  Tonsillectomy.  3.  Carpal tunnel release. 4.  Hysterectomy.   ALLERGIES: No known drug allergies.   HOME MEDICATIONS:  1.  Tylenol 650 mg every 4 hours as needed.  2.  Zoloft 25 mg daily.  3.  Potassium chloride 20 mEq every 6 hours.  4.  Protonix 40 mg daily.  5.   Olmesartan 40 mg once a day.  6.  Toprol-XL 50 mg once a day.  7.  Amlodipine 10 mg daily.   SOCIAL HISTORY: No history of smoking, drinking alcohol, or using illicit drugs. Lives in a skilled nursing facility.   FAMILY HISTORY: Coronary artery disease, hypertension, gastric cancer.   REVIEW OF SYSTEMS:  CONSTITUTIONAL: Experiencing generalized weakness.  EYES: No change in vision.  ENT: No change in hearing.   RESPIRATORY: Has shortness of breath with exertion.  CARDIOVASCULAR: No chest pain, palpitations.  GASTROINTESTINAL: No nausea, vomiting, abdominal pain.  GENITOURINARY: No dysuria or hematuria.  HEMATOLOGIC: No easy bruising or bleeding.  SKIN: No rash or lesions.  MUSCULOSKELETAL: Has arthritis.  NEUROLOGIC: No weakness or numbness in any part of the body.   PHYSICAL EXAMINATION:  GENERAL: This is a well-built, well-nourished, age-appropriate female lying down in the bed, not in distress.  VITAL SIGNS: Temperature 99.1, pulse 75, blood pressure 164/52, respiratory rate of 18, oxygen saturation is 99% on 2 L of oxygen.  HEENT: Head normocephalic, atraumatic. There is no scleral icterus. Conjunctivae normal. Pupils equal and react to light. Mucous membranes moist. No pharyngeal erythema.  NECK: Supple. No lymphadenopathy. No JVD. No carotid bruit.  CHEST: Has no focal tenderness.  LUNGS: Bilateral decreased breath sounds in the lower lobes. Bibasilar crackles are heard.  HEART: S1, S2, regular. Systolic murmur heard, radiating to the carotids.  ABDOMEN: Bowel sounds plus. Soft, nontender, nondistended.  EXTREMITIES: Mild trace  edema around the ankles.  NEUROLOGIC: The patient is alert, oriented to place, person, and time. No apparent cranial nerve abnormalities. Motor 5/5 in upper and lower extremities.   LABORATORY DATA: CMP: BUN 30, creatinine of 1.37. The rest of all the values are within normal limits.   CBC: WBC of 7.7, hemoglobin 7.5, platelet count of 279,000. UA: 3+  leukocyte esterase, WBC of 67, bacteria of 2+.   ASSESSMENT AND PLAN: 1.  [name] is a 81 year old female who comes to the Emergency Department with shortness of breath with exertion. We do not have any echocardiogram reports. We will obtain echocardiogram. We will diurese the patient and follow up. The patient is also found to be hypertensive. We will continue with the Lasix and follow up.  2.  Urinary tract infection. Will keep the patient on Rocephin.  3.  Hypertension. Continue with olmesartan, metoprolol, and amlodipine.  4.  Keep the patient on deep vein thrombosis prophylaxis with Lovenox.   TIME SPENT: 50 minutes.    ____________________________ Susa GriffinsPadmaja Kaytlynne Neace, MD pv:ST D: 03/15/2014 00:35:41 ET T: 03/15/2014 02:15:23 ET JOB#: [id number]  cc: Susa GriffinsPadmaja Kewon Statler, MD, <Dictator> Susa GriffinsPADMAJA Yeraldy Spike MD ELECTRONICALLY SIGNED 03/23/2014 21:39

## 2014-10-15 NOTE — Discharge Summary (Signed)
PATIENT NAME:  Madison Patel, Madison Patel MR#:  [id number] DATE OF BIRTH:  Aug 31, 1912  DATE OF ADMISSION:  01/09/2014 DATE OF DISCHARGE:  01/12/2014  REASON FOR ADMISSION: Rectal bleeding.   HISTORY OF PRESENT ILLNESS: Please see the dictated HPI done by Dr. Leotis ShamesJasmine Singh on 01/08/2014.   PAST MEDICAL HISTORY:  1.  Chronic anemia.  2.  Benign hypertension.  3.  Hyperlipidemia.  4.  History of GI bleed.  5.  Hemorrhoids.  6.  History of pneumonia.  7.  Previous stroke.  8.  Gout.  9.  Status post cholecystectomy.  10.  Status post hysterectomy.   MEDICATIONS ON ADMISSION: Please see admission note.   ALLERGIES: NO KNOWN DRUG ALLERGIES.   SOCIAL HISTORY, FAMILY HISTORY, AND REVIEW OF SYSTEMS:  As per admission note.   PHYSICAL EXAMINATION:  GENERAL: The patient was in no acute distress.  VITAL SIGNS: Stable and she was afebrile.  HEENT: Unremarkable.  NECK: Supple without JVD.  LUNGS: Clear.  CARDIAC: Regular rate and rhythm. Normal S1, S2.  ABDOMEN: Soft and nontender.  EXTREMITIES: Without edema.  NEUROLOGIC: Grossly nonfocal.   HOSPITAL COURSE: The patient was admitted with rectal bleeding. She was hemodynamically stable. She was seen in consultation by GI, who suggested possible colonoscopy, if the patient desired. However, the patient's family deferred colonoscopy. Her hemoglobin remained stable with no evidence of significant bleeding. She was seen by physical therapy. Discharge back to the assisted living facility was recommended. She was noted to have a UTI during her hospitalization, which was treated with antibiotics. Her potassium was low, which was supplemented. By 01/12/2014, the patient was stable and ready for discharge.   DISCHARGE DIAGNOSES:  1.  Rectal bleeding, presumably due to internal hemorrhoids.  2.  Acute blood loss anemia.  3.  Urinary tract infection.  4.  Hypokalemia. 5.  Benign hypertension.  6.  Previous stroke.   DISCHARGE MEDICATIONS:  1.   Atorvastatin 10 mg p.o. daily.  2.  Sertraline 25 mg p.o. daily.  3.  Aspirin 81 mg p.o. daily.  4.  Advair 250/50 one puff b.i.d.  5.  Hydrocortisone cream applied to rectum t.i.d.  6.  Lasix 40 mg p.o. daily.  7.  Colchicine 0.6 mg p.o. daily.  8.  Toprol-XL 50 mg p.o. daily.  9.  Norvasc 10 mg p.o. daily.  10.  Klor-Con 20 mEq p.o. 4 times daily.  11.  Benicar 40 mg p.o. daily.  12.  Anusol suppositories per rectum b.i.d.  13.  Protonix 40 mg p.o. daily.  14.  Levaquin 250 mg p.o. daily x 10 days.   FOLLOW-UP PLANS AND APPOINTMENTS: The patient was discharged to the assisted living facility on a low sodium diet. She will follow up with me within 1 week's time, sooner if needed.    ____________________________ Duane LopeJeffrey D. Judithann SheenSparks, MD jds:ts D: 01/22/2014 12:29:35 ET T: 01/22/2014 15:27:00 ET JOB#: [id number]  cc: Duane LopeJeffrey D. Judithann SheenSparks, MD, <Dictator> Kadarius Cuffe Rodena Medin Shaila Gilchrest MD ELECTRONICALLY SIGNED 01/22/2014 18:14

## 2014-10-15 NOTE — Consult Note (Signed)
Had long discussion with son, POA. Even though patient was interested in finding out the cause of her recurrent bleeding, son was concerned about the risks and potential complications of proceeding with bowel prep and then colonoscopy. Pt is DNR, and decision was made to cancel colonscopy. Though unclear as to the etiology of her rectal bleeding, would treat her for possible hemorroids and transfuse as necessary. Will sign off. thanks.  Electronic Signatures: Lutricia Feilh, Cecilia Nishikawa (MD)  (Signed on 21-Jul-15 07:21)  Authored  Last Updated: 21-Jul-15 07:21 by Lutricia Feilh, Eleasha Cataldo (MD)

## 2014-10-15 NOTE — Consult Note (Signed)
Brief Consult Note: Diagnosis: Lower GI bleed, IDA, hypertension, mother history of colon cancer.   Consult note dictated.   Recommend to proceed with surgery or procedure.   Orders entered.   Discussed with Attending MD.   Comments: Patient's presentation discussed with Dr. Lutricia FeilPaul Oh and goal is to proceed with diagnostic colonoscopy tomorrow to allow direct luminal evaluation of colon.  Orders placed for procedure to be performed tomorrow.  Requesting consent for procedure to be obtained from her son as well given her age, as well as risks of procedure.  Will continue to monitor.  Transfuse as necessary.  Electronic Signatures: Rodman KeyHarrison, Dawn S (NP)  (Signed 20-Jul-15 15:41)  Authored: Brief Consult Note   Last Updated: 20-Jul-15 15:41 by Rodman KeyHarrison, Dawn S (NP)

## 2014-10-27 DIAGNOSIS — R220 Localized swelling, mass and lump, head: Secondary | ICD-10-CM | POA: Diagnosis not present

## 2014-11-04 DIAGNOSIS — J309 Allergic rhinitis, unspecified: Secondary | ICD-10-CM | POA: Diagnosis not present

## 2014-12-07 DIAGNOSIS — F39 Unspecified mood [affective] disorder: Secondary | ICD-10-CM

## 2014-12-07 DIAGNOSIS — J8410 Pulmonary fibrosis, unspecified: Secondary | ICD-10-CM | POA: Diagnosis not present

## 2014-12-07 DIAGNOSIS — I10 Essential (primary) hypertension: Secondary | ICD-10-CM | POA: Diagnosis not present

## 2014-12-07 DIAGNOSIS — M1000 Idiopathic gout, unspecified site: Secondary | ICD-10-CM | POA: Diagnosis not present

## 2014-12-07 DIAGNOSIS — I5030 Unspecified diastolic (congestive) heart failure: Secondary | ICD-10-CM | POA: Diagnosis not present

## 2014-12-07 DIAGNOSIS — K6289 Other specified diseases of anus and rectum: Secondary | ICD-10-CM

## 2014-12-15 DIAGNOSIS — R4181 Age-related cognitive decline: Secondary | ICD-10-CM | POA: Diagnosis not present

## 2014-12-27 ENCOUNTER — Telehealth: Payer: Self-pay

## 2015-01-09 ENCOUNTER — Telehealth: Payer: Self-pay

## 2015-01-09 NOTE — Telephone Encounter (Signed)
Madison Patel with [facility] left v/m requesting cb about status of hospice certification faxed June 15, 2015.Please advise.

## 2015-01-10 NOTE — Telephone Encounter (Signed)
I did not get this Have her refax--give her our back number

## 2015-01-11 NOTE — Telephone Encounter (Signed)
Certification was scanned in chart, I will refax Left message asking Madison Patel to return my call.

## 2015-01-23 NOTE — Telephone Encounter (Signed)
PLEASE NOTE: All timestamps contained within this report are represented as [place]. CONFIDENTIALTY NOTICE: This fax transmission is intended only for the addressee. It contains information that is legally privileged, confidential or otherwise protected from use or disclosure. If you are not the intended recipient, you are strictly prohibited from reviewing, disclosing, copying using or disseminating any of this information or taking any action in reliance on or regarding this information. If you have received this fax in error, please notify us immediately by telephone so that we can arrange for its return to us. Phone: [phone], Toll-Free: [phone], Fax: [phone] Page: 1 of 2 Call Id: [id number] [facility] Night - Client TELEPHONE ADVICE RECORD [facility] Patient Name: Madison LoopANCY Adinolfi Gender: Female DOB: [birth year] Age: 81 Y 9 M 17 D Return Phone Number: Address: City/State/Zip: [place] Statistic[facility]Client [facility] [place] Night Physician Tillman AbideLetvak, Richard Contact Type Call Call Type Page Only Caller Name [name] Is this call to report lab results? No Return Phone Number Please choose phone number Initial Comment [phone]) is calling to report pt passing Nurse Assessment Guidelines Guideline Title Affirmed Question Affirmed Notes Nurse Date/Time (Eastern Time) Disp. Time Lamount Cohen(Eastern Time) Disposition Final User 01/04/2015 12:43:00 AM Send to [facility] Madison HightsC Paging Queue Leeroy ChaCowan, Joseph 01/05/2015 1:05:24 AM Paged On Call back to Call Center - PC Dennison BullaMonroe, Jason 01/02/2015 1:19:13 AM Paged On Call back to Call Center - PC Dennison BullaMonroe, Jason 01/08/2015 1:43:06 AM Max Attempt Unable To Reach On Call Dennison BullaMonroe, Jason 01/08/2015 1:43:54 AM Paged On Call back to Call Center - PC Dennison BullaMonroe, Jason 01/10/2015 1:50:20 AM Max Attempt Unable To Reach On Call Yes Dennison BullaMonroe,  Jason After Care Instructions Given Call Event Type User Date / Time Description Comments User: Dennison BullaJason, Monroe Date/Time Lamount Cohen(Eastern Time): 01/12/2015 1:42:47 AM I tried to call the on call three times and no one called back. I then called the caller back and she said we could just fax the information to the office letting them know that she is going to release the body to the funeral home. Paging DoctorName Phone DateTime Result/Outcome Message Type Notes Duncan Dullullo, Teresa [phone] 01/01/2015 1:05:24 AM Called On Call Provider - Left Message Doctor Paged Duncan Dullullo, Teresa [phone] 01/16/2015 1:19:13 AM Called On Call Provider - Left Message Doctor Paged PLEASE NOTE: All timestamps contained within this report are represented as [place]. CONFIDENTIALTY NOTICE: This fax transmission is intended only for the addressee. It contains information that is legally privileged, confidential or otherwise protected from use or disclosure. If you are not the intended recipient, you are strictly prohibited from reviewing, disclosing, copying using or disseminating any of this information or taking any action in reliance on or regarding this information. If you have received this fax in error, please notify us immediately by telephone so that we can arrange for its return to us. Phone: [phone], Toll-Free: [phone], Fax: [phone] Page: 2 of 2 Call Id: [id number] Paging DoctorName Phone DateTime Result/Outcome Message Type Notes Duncan Dullullo, Teresa [phone] 01/03/2015 1:43:54 AM Called On Call Provider - Left Message Doctor Paged Duncan Dullullo, Teresa 01/13/2015 1:44:02 AM Unable to Reach on call - Max Attempts Message Result

## 2015-01-23 NOTE — Telephone Encounter (Signed)
This was expected She was on hospice

## 2015-01-23 DEATH — deceased

## 2015-09-17 IMAGING — CR DG CHEST 1V
1 series · 1 of 1 positions shown · non-contrast
Comparison: 10/19/2012

CLINICAL DATA: Rectal bleeding.  Chronic anemia.

EXAM:
CHEST - 1 VIEW

[ap]
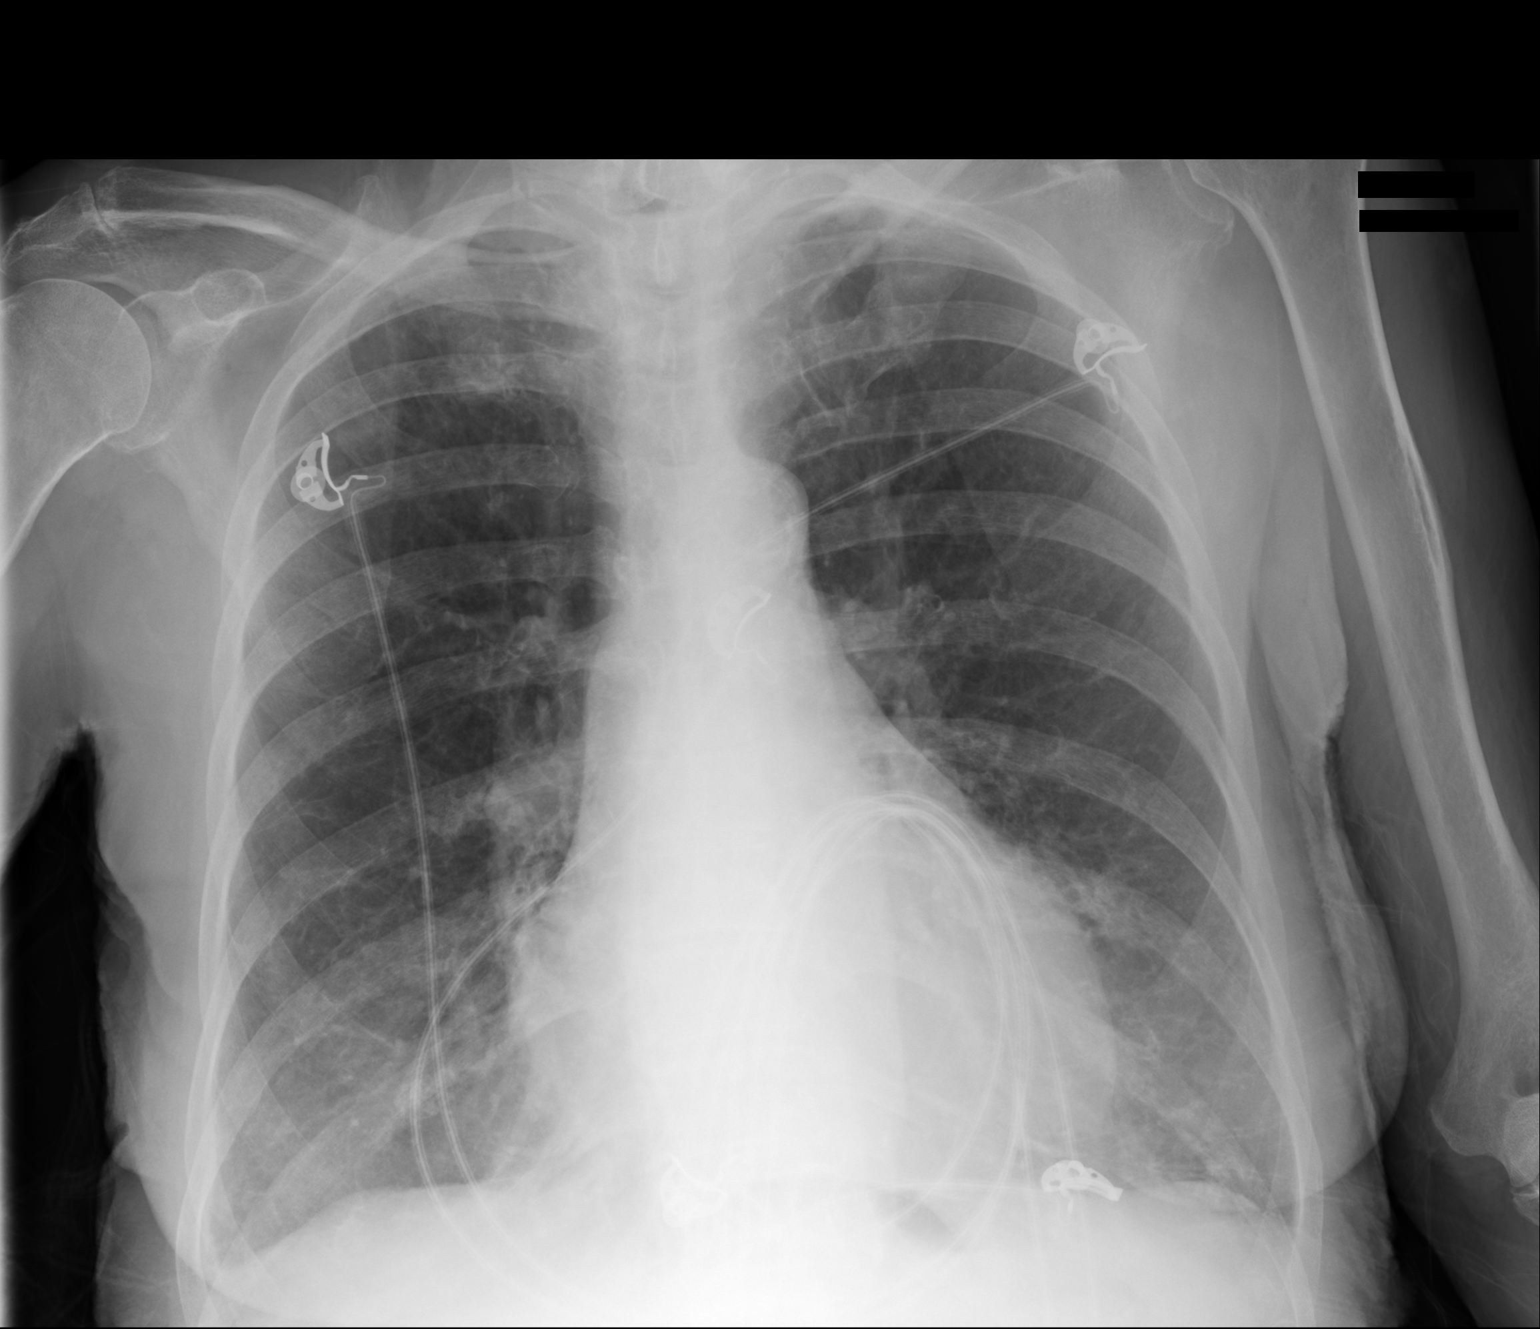

[1 of 1 positions shown; findings below may reference images not displayed]

FINDINGS: Pulmonary hyperinflation again seen, consistent with COPD. No
evidence of pulmonary infiltrate or edema. No evidence of pleural
effusion. Heart size is stable.
IMPRESSION: COPD.  No acute findings.

## 2015-11-21 IMAGING — CR DG CHEST 1V PORT
1 series · 1 of 1 positions shown · non-contrast
Comparison: 01/08/2014

CLINICAL DATA: Shortness of breath

EXAM:
PORTABLE CHEST - 1 VIEW

[ap]
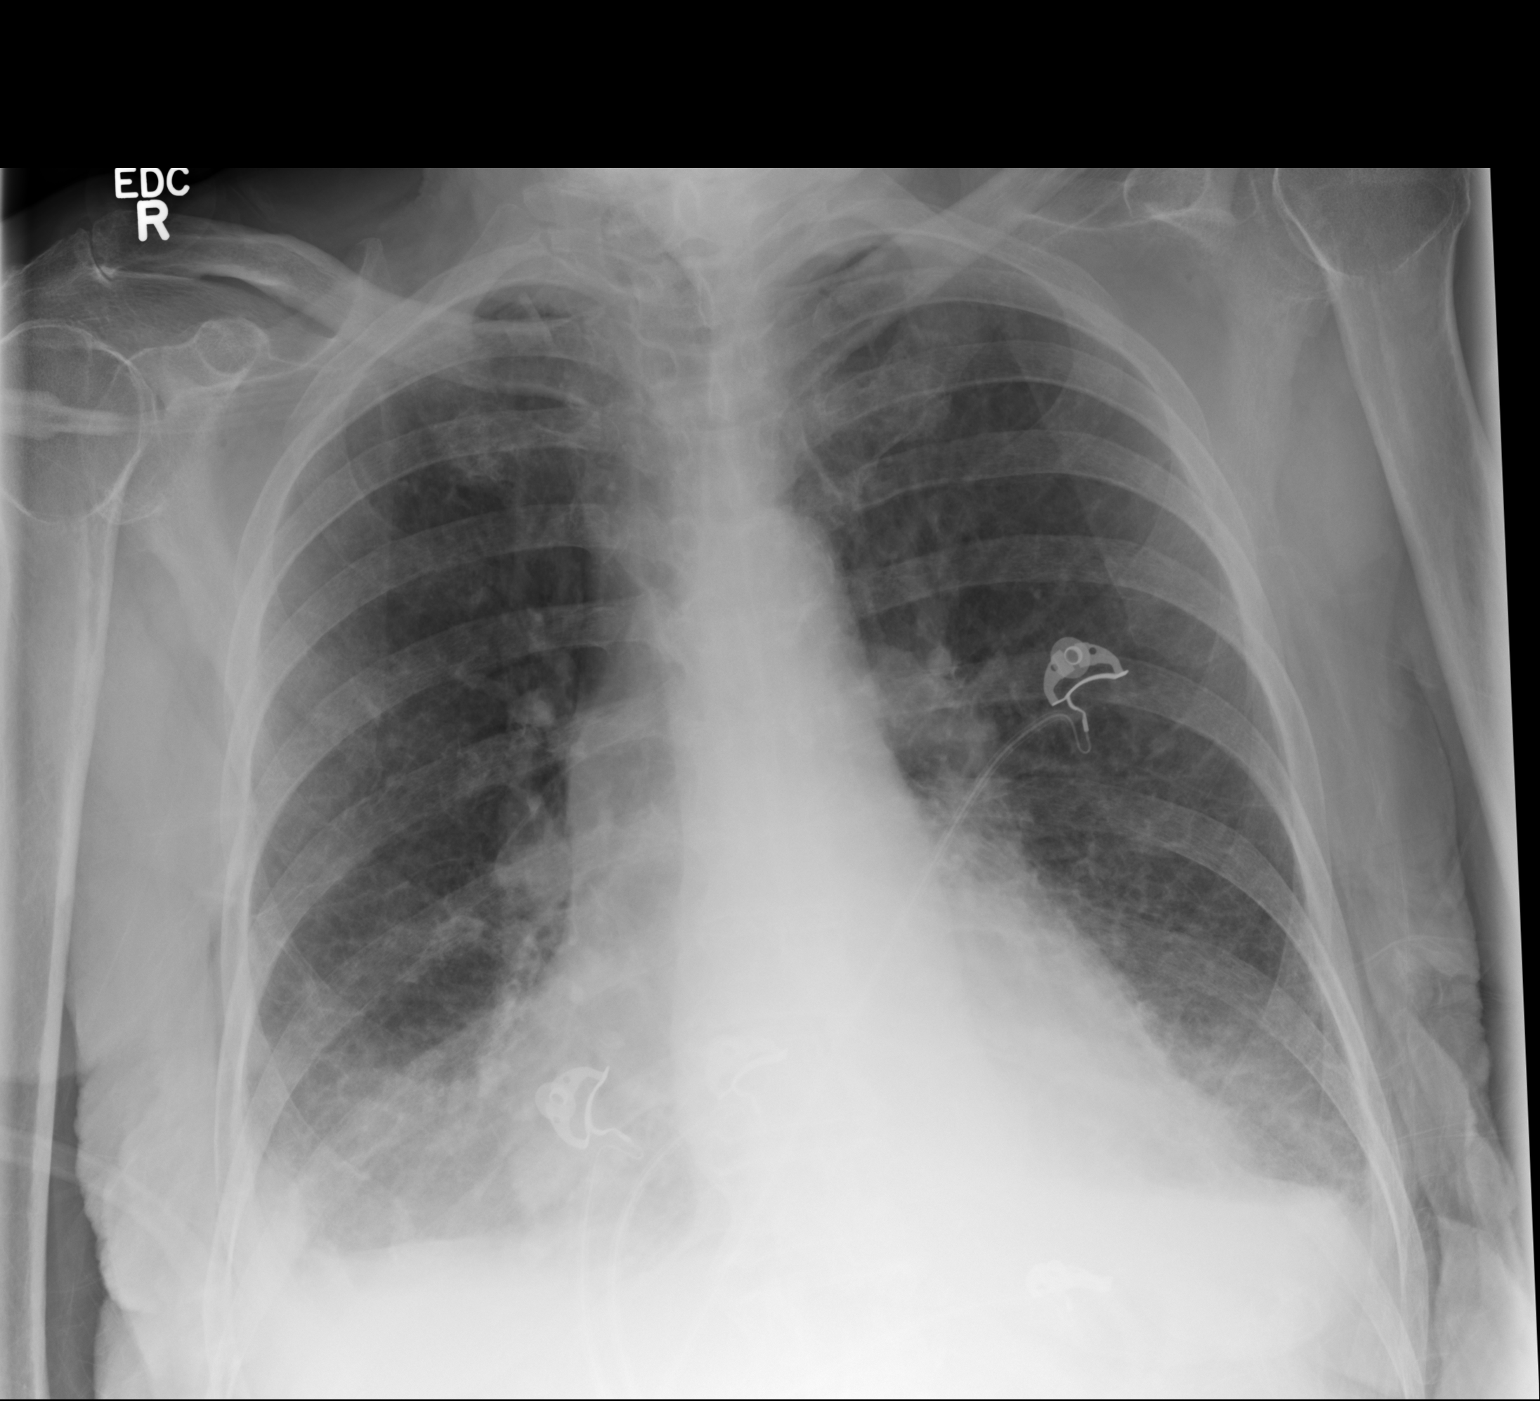

[1 of 1 positions shown; findings below may reference images not displayed]

FINDINGS: Cardiomegaly with mild interstitial edema. Small bilateral pleural
effusions, right greater than left. Mild patchy bilateral lower lobe
opacities, likely atelectasis.

No pneumothorax
IMPRESSION: Cardiomegaly with mild interstitial edema.

Mild patchy bilateral opacities, likely atelectasis.

Small bilateral pleural effusions, right greater than left.

## 2015-11-22 IMAGING — CR DG CHEST 2V
1 series · 3 of 3 positions shown · non-contrast
Comparison: [DATE].

CLINICAL DATA: Shortness of breath.

EXAM:
CHEST  2 VIEW

[Series 1: dxr chest pa (or ap) and lateral · 0.14mm/px · 3 of 3 slices shown]
[im 1/3]
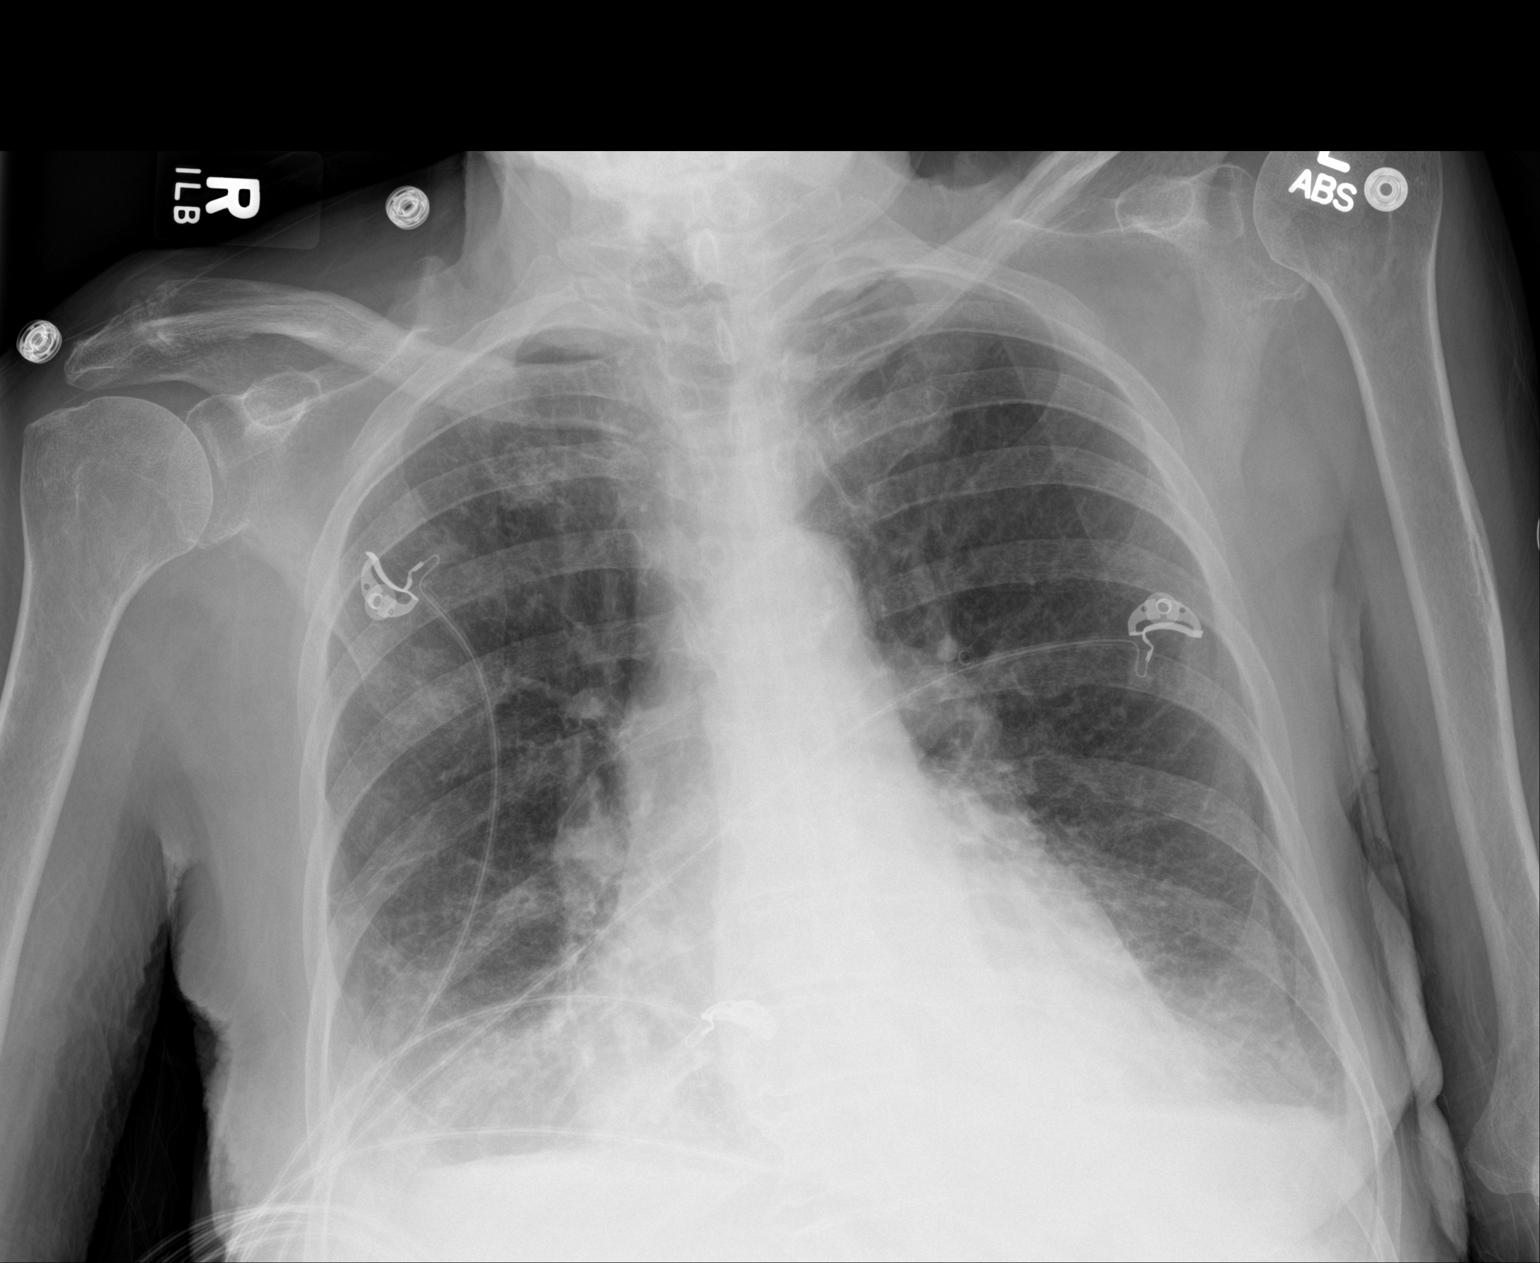
[im 2/3]
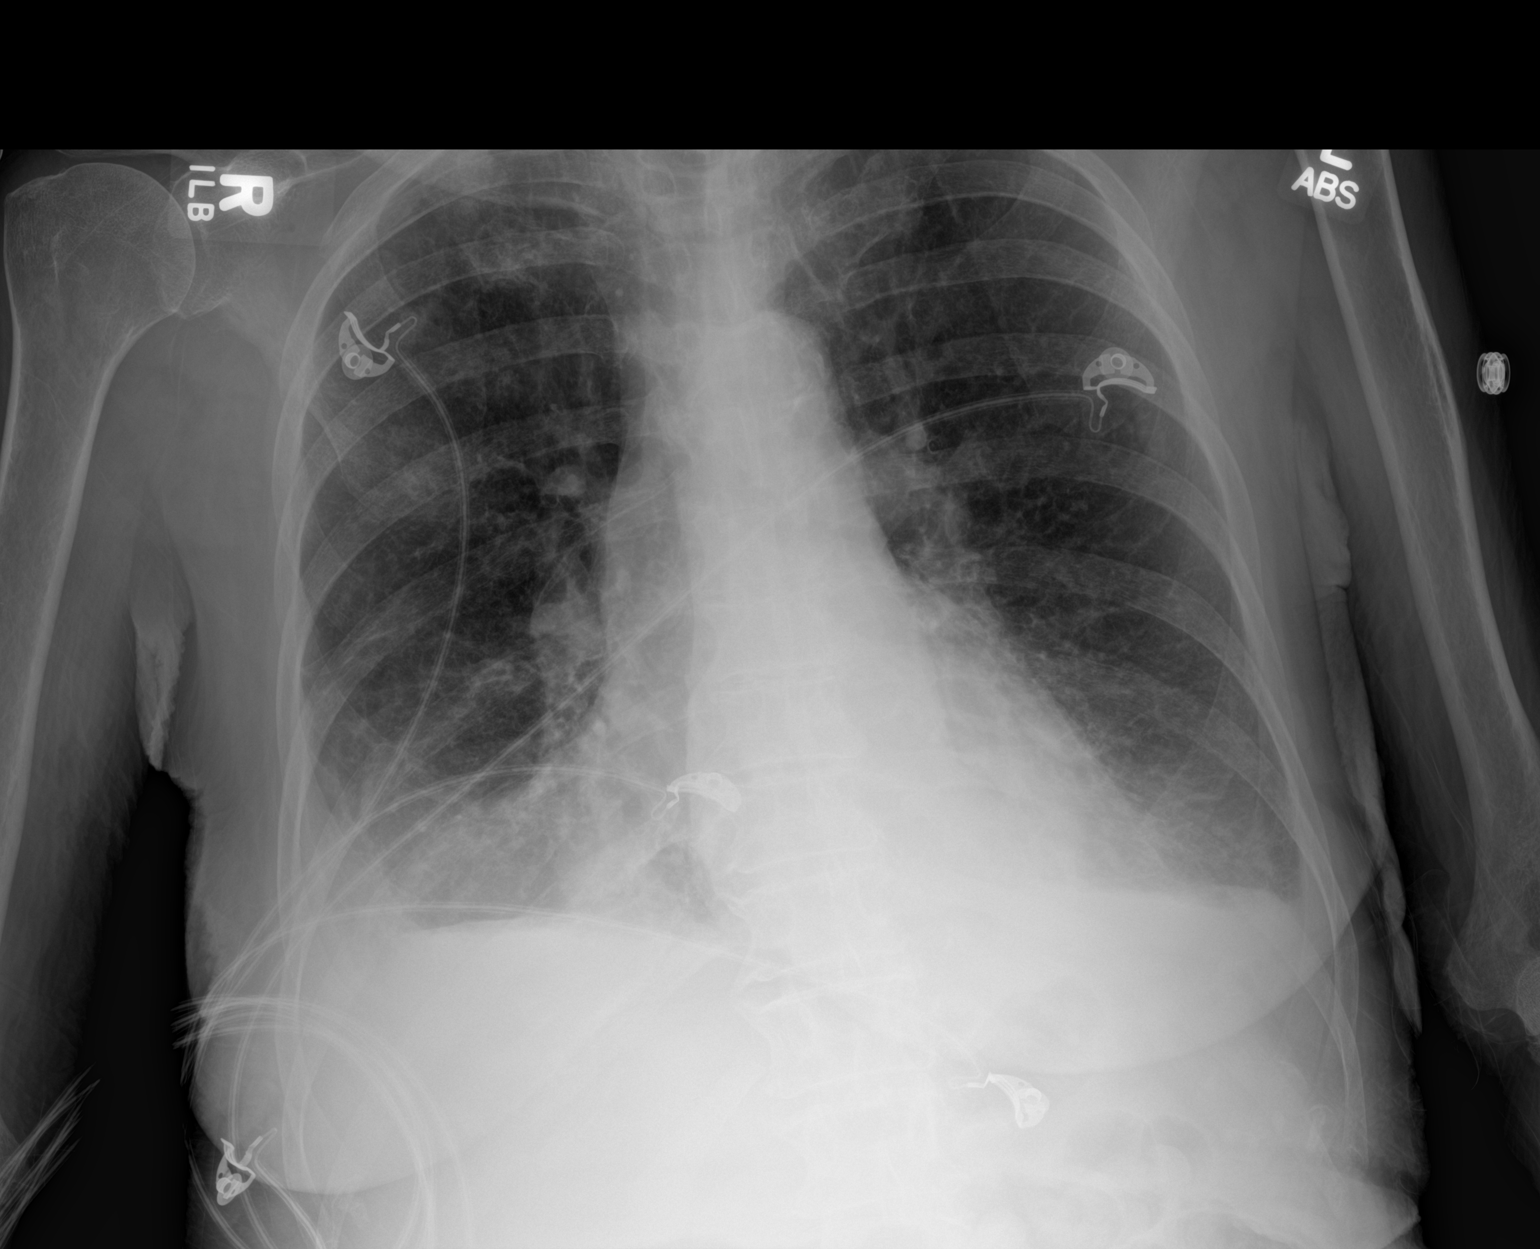
[im 3/3]
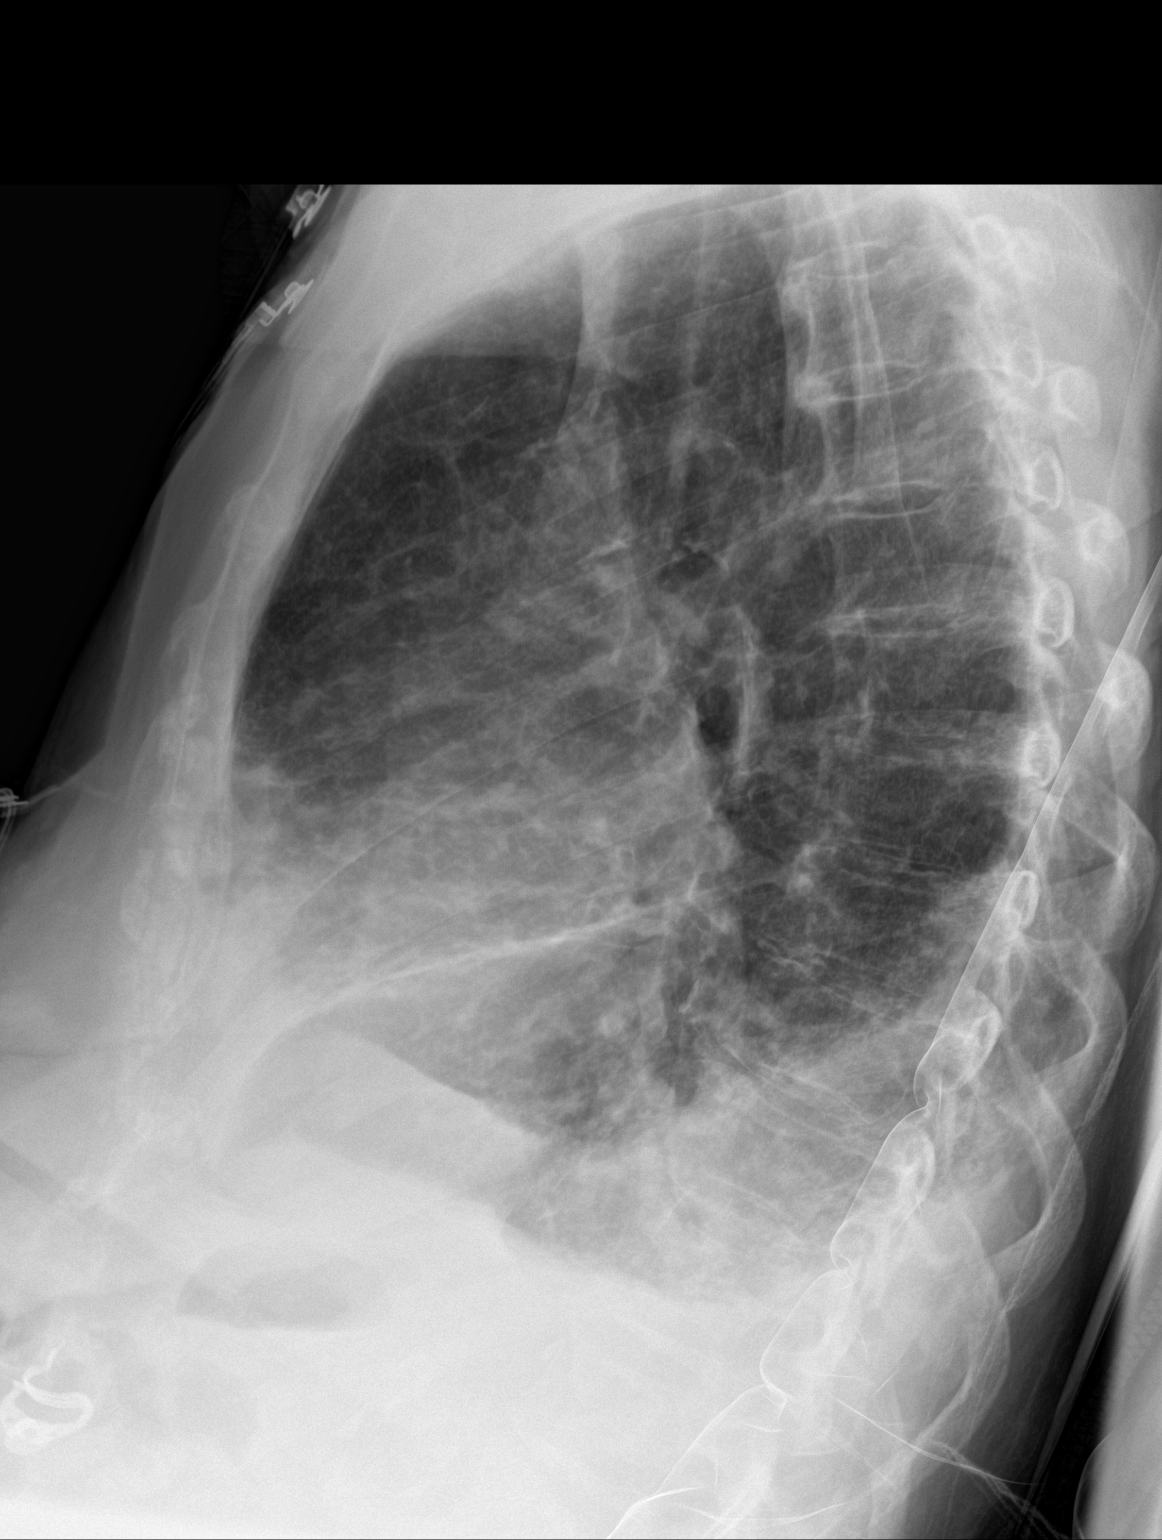

[3 of 3 positions shown; findings below may reference images not displayed]

FINDINGS: Mediastinum and hilar structures are unremarkable. Cardiomegaly with
mild pulmonary vascular prominence and interstitial prominence with
bilateral small pleural effusions are noted. These findings are
consistent with congestive heart failure. No acute bony abnormality
identified.
IMPRESSION: Congestive heart failure with mild pulmonary interstitial edema and
small bilateral pleural effusions.
